# Patient Record
Sex: Male | Born: 1995
Health system: Southern US, Community
[De-identification: ages and names within clinical notes are randomized; demographics above are authoritative.]

## PROBLEM LIST (undated history)

## (undated) HISTORY — PX: TONSILLECTOMY: SUR1361

---
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
## 2017-01-18 ENCOUNTER — Emergency Department
Admission: EM | Admit: 2017-01-18 | Discharge: 2017-01-18 | Disposition: A | Discharge status: Home / Self Care | Payer: Medicaid Other | Attending: Emergency Medicine | Admitting: Emergency Medicine

## 2017-01-18 ENCOUNTER — Encounter: Payer: Self-pay | Admitting: Emergency Medicine

## 2017-01-18 ENCOUNTER — Other Ambulatory Visit: Payer: Self-pay

## 2017-01-18 DIAGNOSIS — Z79899 Other long term (current) drug therapy: Secondary | ICD-10-CM | POA: Insufficient documentation

## 2017-01-18 DIAGNOSIS — F17200 Nicotine dependence, unspecified, uncomplicated: Secondary | ICD-10-CM | POA: Insufficient documentation

## 2017-01-18 DIAGNOSIS — L509 Urticaria, unspecified: Secondary | ICD-10-CM | POA: Insufficient documentation

## 2017-01-18 MED ORDER — CETIRIZINE HCL 5 MG/5ML PO SOLN
5.0000 mg | Freq: Once | ORAL | Status: DC
  Filled 2017-01-18: qty 5

## 2017-01-18 MED ORDER — CETIRIZINE HCL 5 MG/5ML PO SOLN: 5.0000 mg | Freq: Every day | ORAL | 0 refills | Status: DC

## 2017-01-18 MED ORDER — CETIRIZINE HCL 10 MG PO TABS
5.0000 mg | ORAL_TABLET | Freq: Once | ORAL | Status: AC
  Administered 2017-01-18: 5 mg via ORAL
  Filled 2017-01-18: qty 1

## 2017-01-18 NOTE — ED Provider Notes (Signed)
[facility] Emergency Department Provider Note  ____________________________________________  Time seen: Approximately 6:05 AM  I have reviewed the triage vital signs and the nursing notes.   HISTORY  Chief Complaint Urticaria   HPI Edward Miller is a 21 y.o. male no significant past medical history who presents for evaluation of hives. Patient reports that he has had generalized hives since yesterday which is pruritic and intermittent. He has been taking Benadryl at home with some relief but they keep recurring. No angioedema, no difficulty swallowing or breathing, no vomiting or diarrhea. Patient denies any history of allergic reactions. No new soaps, detergent, skin products, no medications. Patient denies any recent illness.  History reviewed. No pertinent past medical history.  There are no active problems to display for this patient.   Past Surgical History:  Procedure Laterality Date  . TONSILLECTOMY      Prior to Admission medications   Medication Sig Start Date End Date Taking? Authorizing Provider  cetirizine HCl (ZYRTEC) 5 MG/5ML SOLN Take 5 mLs (5 mg total) by mouth daily. 01/18/17   Nita SickleVeronese, [place], MD  naproxen (NAPROSYN) 500 MG tablet Take 1 tablet (500 mg total) by mouth 2 (two) times daily with a meal. 04/20/15   Joni ReiningSmith, Ronald K, PA-C    Allergies Corn-containing products and Orange fruit [citrus]  No family history on file.  Social History Social History   Tobacco Use  . Smoking status: Current Every Day Smoker  . Smokeless tobacco: Never Used  Substance Use Topics  . Alcohol use: No  . Drug use: No    Review of Systems  Constitutional: Negative for fever. Eyes: Negative for visual changes. ENT: Negative for sore throat. Neck: No neck pain  Cardiovascular: Negative for chest pain. Respiratory: Negative for shortness of breath. Gastrointestinal: Negative for abdominal pain, vomiting or diarrhea. Genitourinary:  Negative for dysuria. Musculoskeletal: Negative for back pain. Skin: Negative for rash. + hives Neurological: Negative for headaches, weakness or numbness. Psych: No SI or HI  ____________________________________________   PHYSICAL EXAM:  VITAL SIGNS: ED Triage Vitals  Enc Vitals Group     BP 01/18/17 0551 132/81     Pulse Rate 01/18/17 0551 75     Resp 01/18/17 0551 18     Temp 01/18/17 0551 97.7 F (36.5 C)     Temp Source 01/18/17 0551 Oral     SpO2 01/18/17 0551 99 %     Weight 01/18/17 0552 180 lb (81.6 kg)     Height 01/18/17 0552 6\' 1"  (1.854 m)     Head Circumference --      Peak Flow --      Pain Score --      Pain Loc --      Pain Edu? --      Excl. in GC? --     Constitutional: Alert and oriented. Well appearing and in no apparent distress. HEENT:      Head: Normocephalic and atraumatic.         Eyes: Conjunctivae are normal. Sclera is non-icteric.       Mouth/Throat: Mucous membranes are moist. Oropharynx is clear, uvula is midline, no angioedema, no swelling, airways patent, no stridor      Neck: Supple with no signs of meningismus. Cardiovascular: Regular rate and rhythm. No murmurs, gallops, or rubs. 2+ symmetrical distal pulses are present in all extremities. No JVD. Respiratory: Normal respiratory effort. Lungs are clear to auscultation bilaterally. No wheezes, crackles, or rhonchi.  Gastrointestinal:  Soft, non tender, and non distended with positive bowel sounds. No rebound or guarding. Musculoskeletal: Nontender with normal range of motion in all extremities. No edema, cyanosis, or erythema of extremities. Neurologic: Normal speech and language. Face is symmetric. Moving all extremities. No gross focal neurologic deficits are appreciated. Skin: Skin is warm, dry and intact. Diffuse hives Psychiatric: Mood and affect are normal. Speech and behavior are normal.  ____________________________________________   LABS (all labs ordered are listed, but only  abnormal results are displayed)  Labs Reviewed - No data to display ____________________________________________  EKG  none  ____________________________________________  RADIOLOGY  none  ____________________________________________   PROCEDURES  Procedure(s) performed: None Procedures Critical Care performed:  None ____________________________________________   INITIAL IMPRESSION / ASSESSMENT AND PLAN / ED COURSE  21 y.o. male no significant past medical history who presents for evaluation of hives x 1 day of unknown trigger. No evidence of anaphylaxis. Will give cetirizine and dc home with prescription. I discussed signs and symptoms of anaphylaxis and recommended return to the emergency room. Otherwise follow up with PCP for allergist referral.      As part of my medical decision making, I reviewed the following data within the electronic MEDICAL RECORD NUMBER Nursing notes reviewed and incorporated, Notes from prior ED visits and [place] Controlled Substance Database    Pertinent labs & imaging results that were available during my care of the patient were reviewed by me and considered in my medical decision making (see chart for details).    ____________________________________________   FINAL CLINICAL IMPRESSION(S) / ED DIAGNOSES  Final diagnoses:  Hives      NEW MEDICATIONS STARTED DURING THIS VISIT:  ED Discharge Orders        Ordered    cetirizine HCl (ZYRTEC) 5 MG/5ML SOLN  Daily     01/18/17 0604       Note:  This document was prepared using Dragon voice recognition software and may include unintentional dictation errors.    Nita SickleVeronese, [place], MD 01/18/17 [phone]

## 2017-01-18 NOTE — ED Triage Notes (Addendum)
Patient to ER for c/o "hives" x2 days. Patient has taken Benadryl, but symptoms return. Patient denies any known new products, foods, etc. Patient alert and oriented with no acute distress.

## 2017-01-23 IMAGING — CR DG ANKLE COMPLETE 3+V*R*
3 series · 3 of 3 positions shown · non-contrast
Comparison: None.

CLINICAL DATA: Injury yesterday

EXAM:
RIGHT ANKLE - COMPLETE 3+ VIEW

[ankle ap]
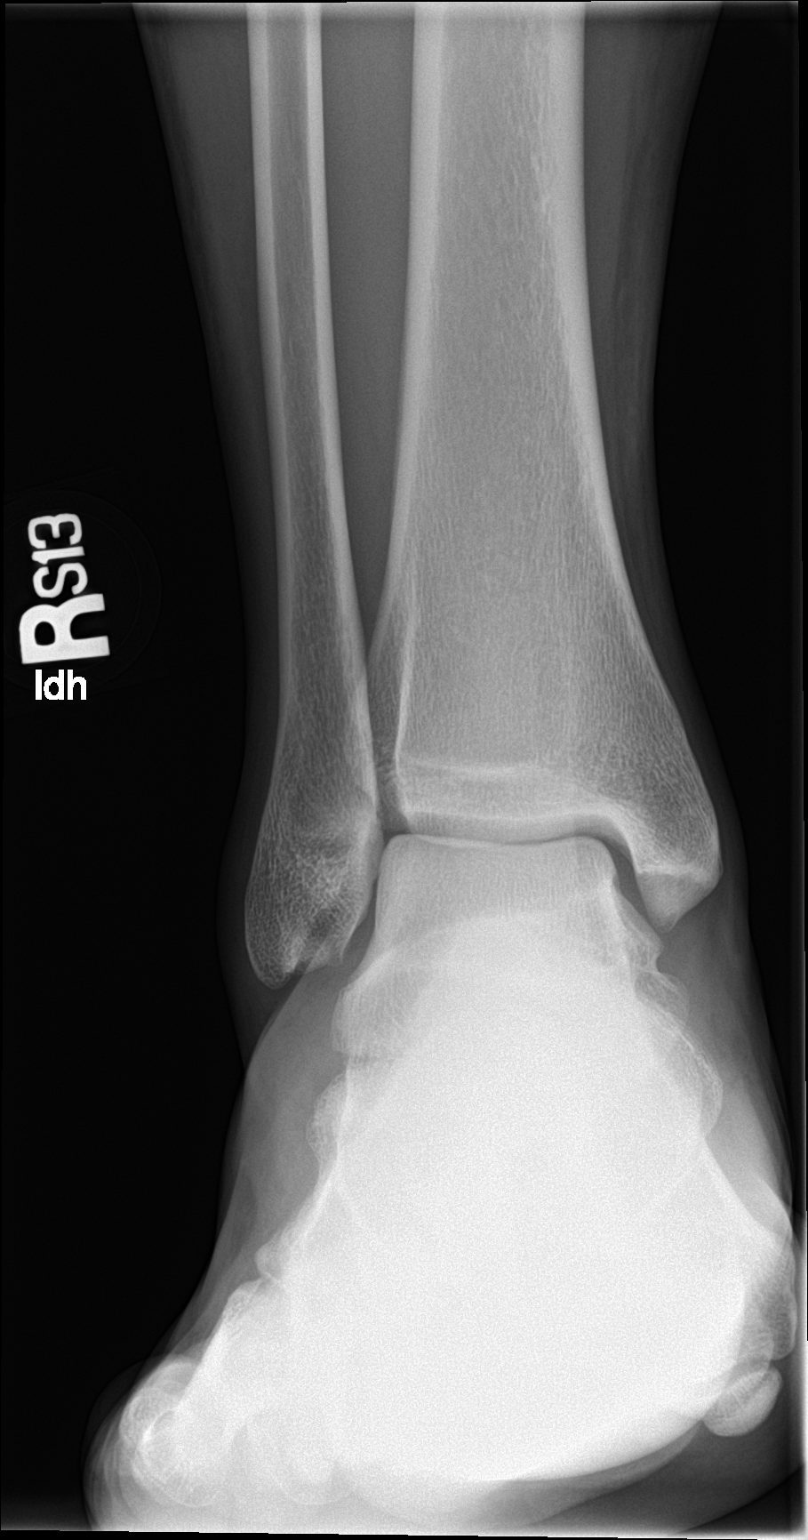

[ankle obl]
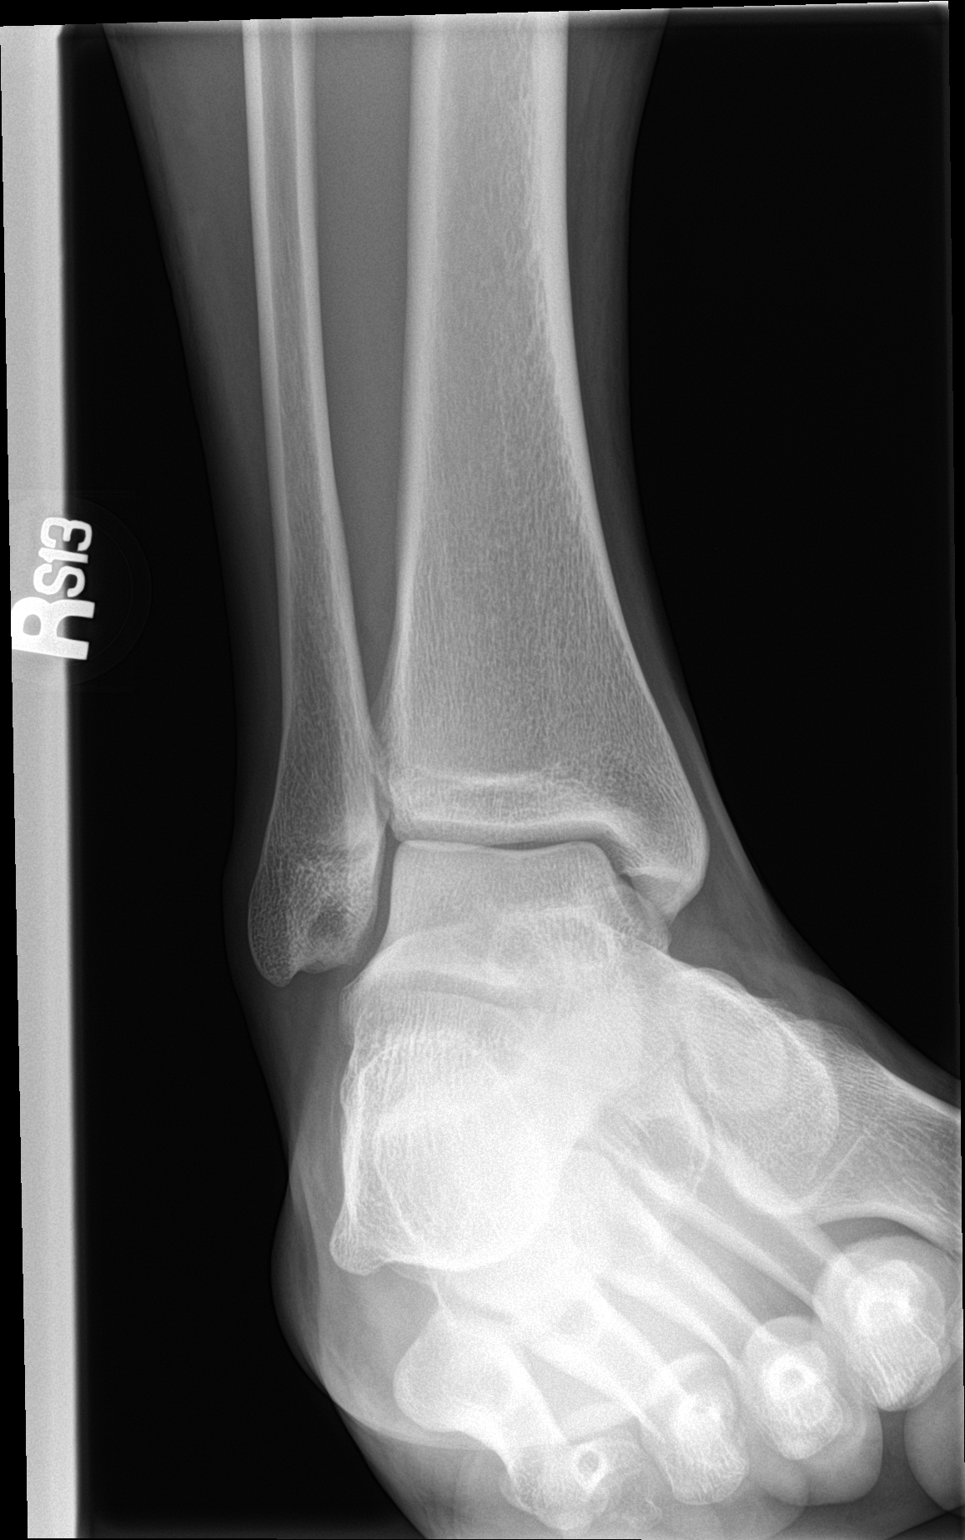

[ankle lat]
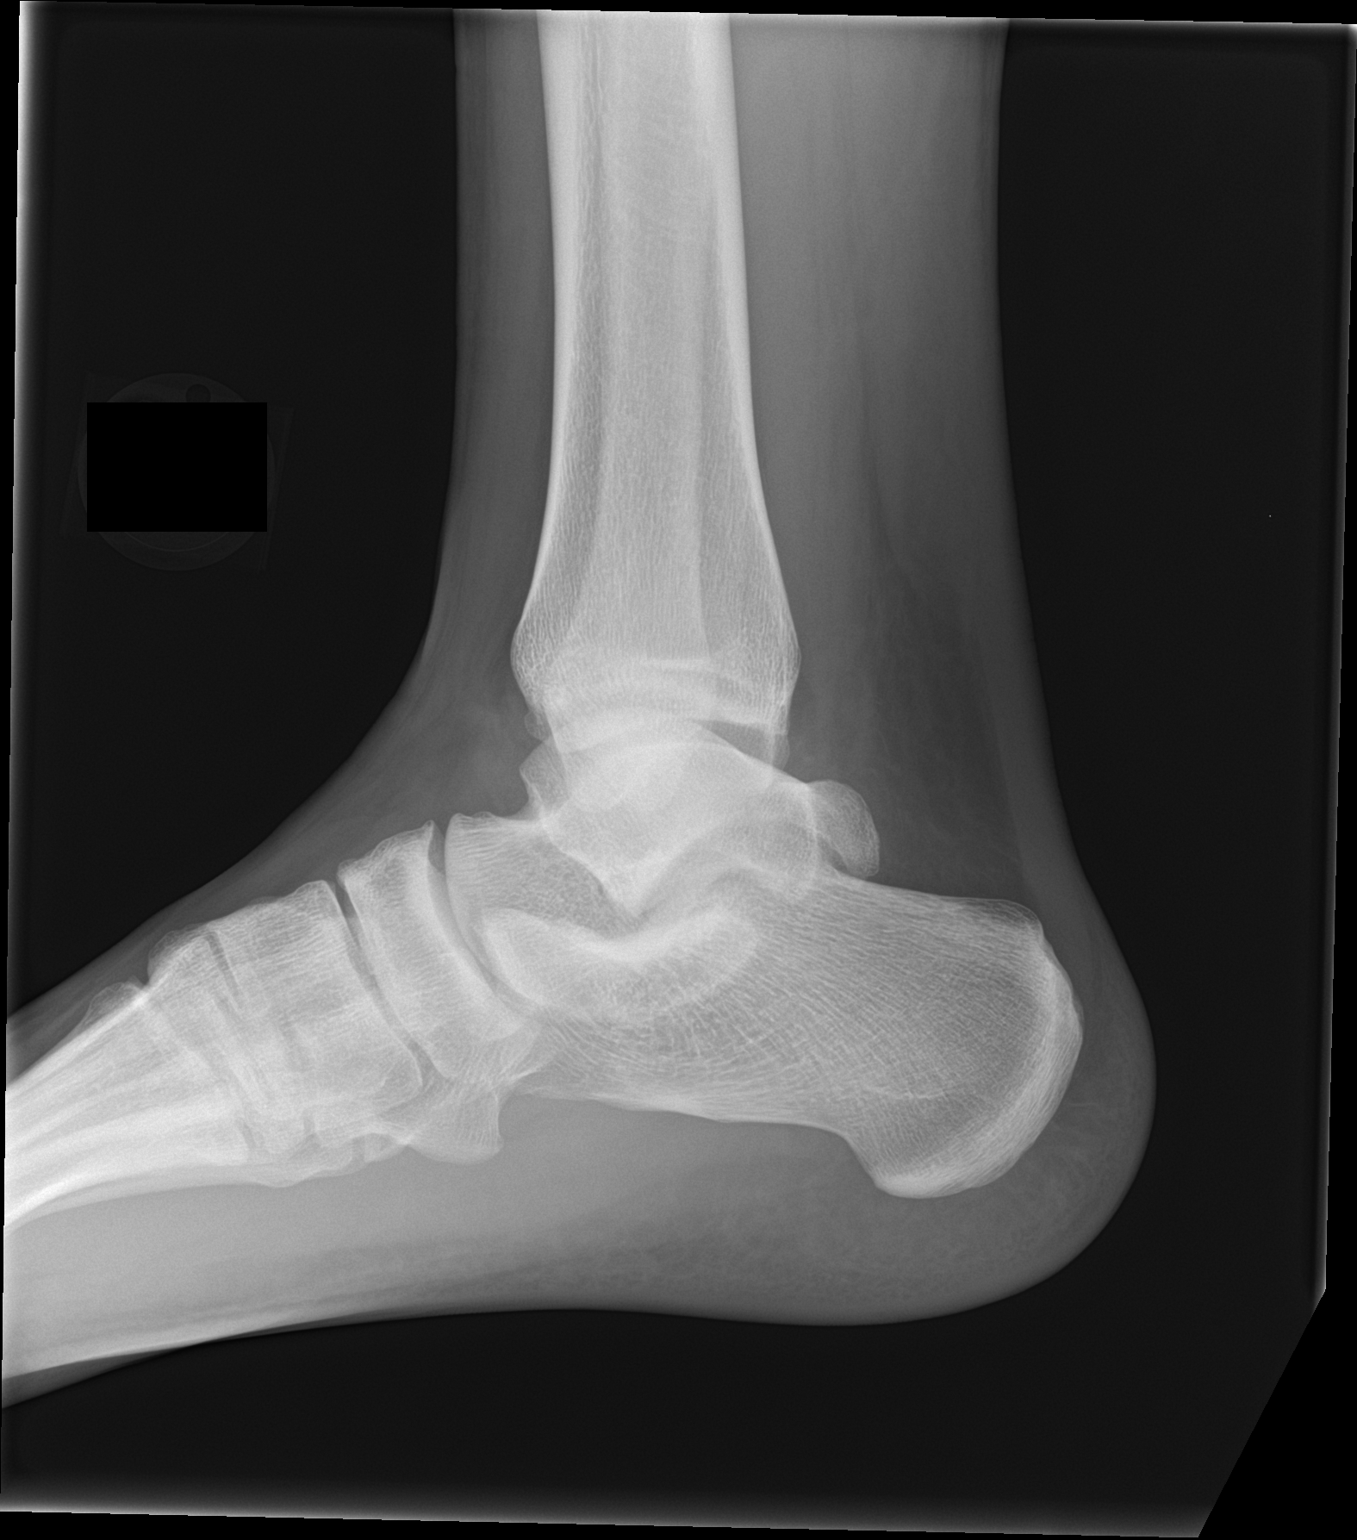

[3 of 3 positions shown; findings below may reference images not displayed]

FINDINGS: No acute fracture. No dislocation.  Unremarkable soft tissues.
IMPRESSION: No acute bony pathology.

## 2018-08-21 ENCOUNTER — Other Ambulatory Visit: Payer: Self-pay

## 2018-08-21 ENCOUNTER — Other Ambulatory Visit (HOSPITAL_COMMUNITY)
Admission: RE | Admit: 2018-08-21 | Discharge: 2018-08-21 | Disposition: A | Discharge status: Home / Self Care | Payer: No Typology Code available for payment source | Source: Ambulatory Visit | Attending: Family Medicine | Admitting: Family Medicine

## 2018-08-21 ENCOUNTER — Ambulatory Visit (INDEPENDENT_AMBULATORY_CARE_PROVIDER_SITE_OTHER): Payer: No Typology Code available for payment source | Admitting: Family Medicine

## 2018-08-21 ENCOUNTER — Encounter: Payer: Self-pay | Admitting: Family Medicine

## 2018-08-21 VITALS — BP 126/74 | HR 83 | Temp 97.9°F | Resp 16 | Ht 73.0 in | Wt 180.9 lb

## 2018-08-21 DIAGNOSIS — Z13220 Encounter for screening for lipoid disorders: Secondary | ICD-10-CM

## 2018-08-21 DIAGNOSIS — Z131 Encounter for screening for diabetes mellitus: Secondary | ICD-10-CM

## 2018-08-21 DIAGNOSIS — Z113 Encounter for screening for infections with a predominantly sexual mode of transmission: Secondary | ICD-10-CM | POA: Diagnosis present

## 2018-08-21 DIAGNOSIS — Z23 Encounter for immunization: Secondary | ICD-10-CM

## 2018-08-21 DIAGNOSIS — Z0000 Encounter for general adult medical examination without abnormal findings: Secondary | ICD-10-CM | POA: Diagnosis present

## 2018-08-21 DIAGNOSIS — Z1159 Encounter for screening for other viral diseases: Secondary | ICD-10-CM

## 2018-08-21 NOTE — Progress Notes (Signed)
Name: Edward Miller   MRN: [id number]    DOB: 12/27/1995   Date:08/21/2018       Progress Note  Subjective  Chief Complaint  Chief Complaint  Patient presents with  . Establish Care    HPI  Patient presents for annual CPE and to establish care.    Social: Living with his maternal aunt.  [place], not in contact with Dad.  No siblings. Not dating, ID's as heterosexual.  Not currently working due to COVID-19, he is not currently in school.  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   USPSTF grade A and B recommendations:  Diet: Eats 2 meals a day, eats out almost ever meal. Eats salads once a week, otherwise not much for veggies. Exercise: Does run and lift weights.   Depression: phq 9 is negative [facility] screen [facility] 08/21/2018  Decreased Interest 0  Down, Depressed, Hopeless 0  PHQ - 2 Score 0  Altered sleeping 0  Tired, decreased energy 0  Change in appetite 0  Feeling bad or failure about yourself  0  Trouble concentrating 0  Moving slowly or fidgety/restless 0  Suicidal thoughts 0  PHQ-9 Score 0  Difficult doing work/chores Not difficult at all    Hypertension:  BP Readings from Last 3 Encounters:  08/21/18 126/74  01/18/17 132/81  04/20/15 123/81 (61 %, Z = 0.28 /  85 %, Z = 1.03)*   *BP percentiles are based on the 2017 AAP Clinical Practice Guideline for boys   Obesity: Wt Readings from Last 3 Encounters:  08/21/18 180 lb 14.4 oz (82.1 kg)  01/18/17 180 lb (81.6 kg) (82 %, Z= 0.91)*  04/20/15 195 lb (88.5 kg) (94 %, Z= 1.52)*   * Growth percentiles are based on CDC (Boys, 2-20 Years) data.   BMI Readings from Last 3 Encounters:  08/21/18 23.87 kg/m  01/18/17 23.75 kg/m (63 %, Z= 0.33)*  04/20/15 25.73 kg/m (87 %, Z= 1.12)*   * Growth percentiles are based on CDC (Boys, 2-20 Years) data.    Lipids:  No results found for: CHOL No results found for: HDL No results found for: LDLCALC No results found for: TRIG No results  found for: CHOLHDL No results found for: LDLDIRECT Glucose:  No results found for: GLUCOSE, GLUCAP    [facility]  AUDIT-C Score  1     Varies in frequency, does binge drink when he drinks.  Using marijuana - using every other day right now.   Single STD testing and prevention (HIV/chl/gon/syphilis): We will check today Hep C: We will check today  Skin cancer: No concerning lesions Colorectal cancer: Denies family or personal history of colorectal cancer, no changes in BM's - no blood in stool, dark and tarry stool, mucus in stool, or constipation/diarrhea.  Prostate/Testicular cancer: No family history, asymptomatic  Lung cancer:  Never smoker. Low Dose CT Chest recommended if Age 65-80 years, 30 pack-year currently smoking OR have quit w/in 15years. Patient does not qualify.   AAA: N/A The USPSTF recommends one-time screening with ultrasonography in men ages 8665 to 3375 years who have ever smoked ECG:  Denies chest pain or shortness of breath.   Advanced Care Planning: A voluntary discussion about advance care planning including the explanation and discussion of advance directives.  Discussed health care proxy and Living will, and the patient was able to identify a health care proxy as Mom - Edward Miller.  Patient does not have a living will at present time. If patient does have living will, I have requested they bring this to the clinic to be scanned in to their chart.  There are no active problems to display for this patient.   Past Surgical History:  Procedure Laterality Date  . TONSILLECTOMY      Family History  Problem Relation Age of Onset  . Thyroid disease Mother     Social History   Socioeconomic History  . Marital status: Single    Spouse name: Not on file  . Number of children: 0  . Years of education: 32  . Highest education level: 12th grade  Occupational History  . Occupation: unemployed  Social Needs   . Financial resource strain: Not hard at all  . Food insecurity    Worry: Never true    Inability: Never true  . Transportation needs    Medical: No    Non-medical: No  Tobacco Use  . Smoking status: Current Some Day Smoker    Types: Cigarettes  . Smokeless tobacco: Never Used  Substance and Sexual Activity  . Alcohol use: Yes  . Drug use: Yes    Types: Marijuana  . Sexual activity: Yes    Partners: Female    Birth control/protection: Condom    Comment: not every time  Lifestyle  . Physical activity    Days per week: 3 days    Minutes per session: 60 min  . Stress: Not at all  Relationships  . Social connections    Talks on phone: More than three times a week    Gets together: More than three times a week    Attends religious service: 1 to 4 times per year    Active member of club or organization: No    Attends meetings of clubs or organizations: Never    Relationship status: Never married  . Intimate partner violence    Fear of current or ex partner: No    Emotionally abused: No    Physically abused: No    Forced sexual activity: No  Other Topics Concern  . Not on file  Social History Narrative  . Not on file     Current Outpatient Medications:  .  cetirizine HCl (ZYRTEC) 5 MG/5ML SOLN, Take 5 mLs (5 mg total) by mouth daily. (Patient not taking: Reported on 08/21/2018), Disp: 50 mL, Rfl: 0 .  naproxen (NAPROSYN) 500 MG tablet, Take 1 tablet (500 mg total) by mouth 2 (two) times daily with a meal. (Patient not taking: Reported on 08/21/2018), Disp: 20 tablet, Rfl: 0  Allergies  Allergen Reactions  . Corn-Containing Products Rash  . Orange Fruit [Citrus] Rash     ROS  Constitutional: Negative for fever or weight change.  Respiratory: Negative for cough and shortness of breath.   Cardiovascular: Negative for chest pain or palpitations.  Gastrointestinal: Negative for abdominal pain, no bowel changes.  Musculoskeletal: Negative for gait problem or joint  swelling.  Skin: Negative for rash.  Neurological: Negative for dizziness or headache.  No other specific complaints in a complete review of systems (except as listed in HPI above).  Objective  Vitals:   08/21/18 1343  BP: 126/74  Pulse: 83  Resp: 16  Temp: 97.9 F (36.6 C)  TempSrc: Oral  SpO2: 99%  Weight: 180 lb 14.4 oz (82.1 kg)  Height: 6\' 1"  (1.854 m)    Body mass index is 23.87 kg/m.  Physical Exam  Constitutional: Patient  appears well-developed and well-nourished. No distress.  HENT: Head: Normocephalic and atraumatic. Ears: B TMs ok, no erythema or effusion; Nose: Nose normal. Mouth/Throat: Oropharynx is clear and moist. No oropharyngeal exudate.  Eyes: Conjunctivae and EOM are normal. Pupils are equal, round, and reactive to light. No scleral icterus.  Neck: Normal range of motion. Neck supple. No JVD present. No thyromegaly present.  Cardiovascular: Normal rate, regular rhythm and normal heart sounds.  No murmur heard. No BLE edema. Pulmonary/Chest: Effort normal and breath sounds normal. No respiratory distress. Abdominal: Soft. Bowel sounds are normal, no distension. There is no tenderness. no masses MALE GENITALIA: Deferred Musculoskeletal: Normal range of motion, no joint effusions. No gross deformities Neurological: he is alert and oriented to person, place, and time. No cranial nerve deficit. Coordination, balance, strength, speech and gait are normal.  Skin: Skin is warm and dry. No rash noted. No erythema.  Psychiatric: Patient has a normal mood and affect. behavior is normal. Judgment and thought content normal.  No results found for this or any previous visit (from the past 2160 hour(s)).   PHQ2/9: Depression screen [facility] 2/[facility] 08/21/2018  Decreased Interest 0  Down, Depressed, Hopeless 0  PHQ - 2 Score 0  Altered sleeping 0  Tired, decreased energy 0  Change in appetite 0  Feeling bad or failure about yourself  0  Trouble concentrating 0  Moving  slowly or fidgety/restless 0  Suicidal thoughts 0  PHQ-9 Score 0  Difficult doing work/chores Not difficult at all    Fall Risk: Fall Risk  08/21/2018  Falls in the past year? 0  Number falls in past yr: 0  Injury with Fall? 0  Follow up Falls evaluation completed    Assessment & Plan  1. Annual physical exam -Prostate cancer screening and PSA options (with potential risks and benefits of testing vs not testing) were discussed along with recent recs/guidelines. -USPSTF grade A and B recommendations reviewed with patient; age-appropriate recommendations, preventive care, screening tests, etc discussed and encouraged; healthy living encouraged; see AVS for patient education given to patient -Discussed importance of 150 minutes of physical activity weekly, eat two servings of fish weekly, eat one serving of tree nuts ( cashews, pistachios, pecans, almonds.Marland Kitchen.) every other day, eat 6 servings of fruit/vegetables daily and drink plenty of water and avoid sweet beverages.  - Lipid panel - COMPLETE METABOLIC PANEL WITH GFR - HIV Antibody (routine testing w rflx) - RPR - Urine cytology ancillary only - Hepatitis C antibody  2. Lipid screening - Lipid panel  3. Diabetes mellitus screening - COMPLETE METABOLIC PANEL WITH GFR  4. Routine screening for STI (sexually transmitted infection) - HIV Antibody (routine testing w rflx) - RPR - Urine cytology ancillary only  5. Need for hepatitis C screening test - Hepatitis C antibody

## 2018-08-21 NOTE — Patient Instructions (Signed)
Preventive Care 19-21 Years Old, Male Preventive care refers to lifestyle choices and visits with your health care provider that can promote health and wellness. This includes:  A yearly physical exam. This is also called an annual well check.  Regular dental and eye exams.  Immunizations.  Screening for certain conditions.  Healthy lifestyle choices, such as eating a healthy diet, getting regular exercise, not using drugs or products that contain nicotine and tobacco, and limiting alcohol use. What can I expect for my preventive care visit? Physical exam Your health care provider will check:  Height and weight. These may be used to calculate body mass index (BMI), which is a measurement that tells if you are at a healthy weight.  Heart rate and blood pressure.  Your skin for abnormal spots. Counseling Your health care provider may ask you questions about:  Alcohol, tobacco, and drug use.  Emotional well-being.  Home and relationship well-being.  Sexual activity.  Eating habits.  Work and work Statistician. What immunizations do I need?  Influenza (flu) vaccine  This is recommended every year. Tetanus, diphtheria, and pertussis (Tdap) vaccine  You may need a Td booster every 10 years. Varicella (chickenpox) vaccine  You may need this vaccine if you have not already been vaccinated. Human papillomavirus (HPV) vaccine  If recommended by your health care provider, you may need three doses over 6 months. Measles, mumps, and rubella (MMR) vaccine  You may need at least one dose of MMR. You may also need a second dose. Meningococcal conjugate (MenACWY) vaccine  One dose is recommended if you are 45-76 years old and a Market researcher living in a residence hall, or if you have one of several medical conditions. You may also need additional booster doses. Pneumococcal conjugate (PCV13) vaccine  You may need this if you have certain conditions and were not  previously vaccinated. Pneumococcal polysaccharide (PPSV23) vaccine  You may need one or two doses if you smoke cigarettes or if you have certain conditions. Hepatitis A vaccine  You may need this if you have certain conditions or if you travel or work in places where you may be exposed to hepatitis A. Hepatitis B vaccine  You may need this if you have certain conditions or if you travel or work in places where you may be exposed to hepatitis B. Haemophilus influenzae type b (Hib) vaccine  You may need this if you have certain risk factors. You may receive vaccines as individual doses or as more than one vaccine together in one shot (combination vaccines). Talk with your health care provider about the risks and benefits of combination vaccines. What tests do I need? Blood tests  Lipid and cholesterol levels. These may be checked every 5 years starting at age 17.  Hepatitis C test.  Hepatitis B test. Screening   Diabetes screening. This is done by checking your blood sugar (glucose) after you have not eaten for a while (fasting).  Sexually transmitted disease (STD) testing. Talk with your health care provider about your test results, treatment options, and if necessary, the need for more tests. Follow these instructions at home: Eating and drinking   Eat a diet that includes fresh fruits and vegetables, whole grains, lean protein, and low-fat dairy products.  Take vitamin and mineral supplements as recommended by your health care provider.  Do not drink alcohol if your health care provider tells you not to drink.  If you drink alcohol: ? Limit how much you have to 0-2  drinks a day. ? Be aware of how much alcohol is in your drink. In the [place] one drink equals one 12 oz bottle of beer (355 mL), one 5 oz glass of wine (148 mL), or one 1 oz glass of hard liquor (44 mL). Lifestyle  Take daily care of your teeth and gums.  Stay active. Exercise for at least 30 minutes on 5 or  more days each week.  Do not use any products that contain nicotine or tobacco, such as cigarettes, e-cigarettes, and chewing tobacco. If you need help quitting, ask your health care provider.  If you are sexually active, practice safe sex. Use a condom or other form of protection to prevent STIs (sexually transmitted infections). What's next?  Go to your health care provider once a year for a well check visit.  Ask your health care provider how often you should have your eyes and teeth checked.  Stay up to date on all vaccines. This information is not intended to replace advice given to you by your health care provider. Make sure you discuss any questions you have with your health care provider. Document Released: 03/12/2001 Document Revised: 01/08/2018 Document Reviewed: 01/08/2018 Elsevier Patient Education  2020 Elsevier Inc.   

## 2018-08-22 LAB — URINE CYTOLOGY ANCILLARY ONLY
Chlamydia: POSITIVE — AB
Neisseria Gonorrhea: NEGATIVE

## 2018-08-24 ENCOUNTER — Encounter: Payer: Self-pay | Admitting: Family Medicine

## 2018-08-24 ENCOUNTER — Other Ambulatory Visit: Payer: Self-pay

## 2018-08-24 ENCOUNTER — Ambulatory Visit (INDEPENDENT_AMBULATORY_CARE_PROVIDER_SITE_OTHER): Payer: No Typology Code available for payment source | Admitting: Family Medicine

## 2018-08-24 VITALS — BP 120/84 | HR 77 | Temp 97.8°F | Resp 18 | Ht 73.0 in | Wt 179.0 lb

## 2018-08-24 DIAGNOSIS — A749 Chlamydial infection, unspecified: Secondary | ICD-10-CM

## 2018-08-24 LAB — COMPLETE METABOLIC PANEL WITH GFR
AG Ratio: 2.2 (calc) (ref 1.0–2.5)
ALT: 13 U/L (ref 9–46)
AST: 15 U/L (ref 10–40)
Albumin: 4.7 g/dL (ref 3.6–5.1)
Alkaline phosphatase (APISO): 52 U/L (ref 36–130)
BUN: 15 mg/dL (ref 7–25)
CO2: 28 mmol/L (ref 20–32)
Calcium: 9.5 mg/dL (ref 8.6–10.3)
Chloride: 104 mmol/L (ref 98–110)
Creat: 1.11 mg/dL (ref 0.60–1.35)
GFR, Est African American: 110 mL/min/{1.73_m2} (ref >=60)
GFR, Est Non African American: 95 mL/min/{1.73_m2} (ref >=60)
Globulin: 2.1 g/dL (calc) (ref 1.9–3.7)
Glucose, Bld: 92 mg/dL (ref 65–99)
Potassium: 4.3 mmol/L (ref 3.5–5.3)
Sodium: 140 mmol/L (ref 135–146)
Total Bilirubin: 0.4 mg/dL (ref 0.2–1.2)
Total Protein: 6.8 g/dL (ref 6.1–8.1)

## 2018-08-24 LAB — LIPID PANEL
Cholesterol: 178 mg/dL (ref <200)
HDL: 52 mg/dL (ref >=40)
LDL Cholesterol (Calc): 112 mg/dL (calc) — ABNORMAL HIGH
Non-HDL Cholesterol (Calc): 126 mg/dL (calc) (ref <130)
Total CHOL/HDL Ratio: 3.4 (calc) (ref <5.0)
Triglycerides: 57 mg/dL (ref <150)

## 2018-08-24 LAB — RPR: RPR Ser Ql: NONREACTIVE

## 2018-08-24 LAB — HEPATITIS C ANTIBODY
Hepatitis C Ab: NONREACTIVE
SIGNAL TO CUT-OFF: 0.01 (ref <1.00)

## 2018-08-24 LAB — HIV ANTIBODY (ROUTINE TESTING W REFLEX): HIV 1&2 Ab, 4th Generation: NONREACTIVE

## 2018-08-24 MED ORDER — AZITHROMYCIN 250 MG PO TABS
1000.0000 mg | ORAL_TABLET | Freq: Once | ORAL | Status: DC
Start: 2018-08-24 — End: 2019-03-04

## 2018-08-24 NOTE — Progress Notes (Signed)
Name: Edward Miller   MRN: [id number]    DOB: 1995/04/16   Date:08/24/2018       Progress Note  Subjective  Chief Complaint  Chief Complaint  Patient presents with  . Follow-up    positive STD check    HPI  PT presents for chlamydia treatment - tested positive on routine testing.  His 1 male partner did tell him today that she tested positive and had treatmment 1 week ago, they have had no intercourse since then.  Azithromycin as witnessed therapy to be adminsitered today.  No abdominal pain, fevers/chills, or penile discharge.  All other STI's are negative.  There are no active problems to display for this patient.   Social History   Tobacco Use  . Smoking status: Never Smoker  . Smokeless tobacco: Never Used  Substance Use Topics  . Alcohol use: Yes     Current Outpatient Medications:  .  cetirizine HCl (ZYRTEC) 5 MG/5ML SOLN, Take 5 mLs (5 mg total) by mouth daily. (Patient not taking: Reported on 08/21/2018), Disp: 50 mL, Rfl: 0 .  naproxen (NAPROSYN) 500 MG tablet, Take 1 tablet (500 mg total) by mouth 2 (two) times daily with a meal. (Patient not taking: Reported on 08/21/2018), Disp: 20 tablet, Rfl: 0  Allergies  Allergen Reactions  . Corn-Containing Products Rash  . Orange Fruit [Citrus] Rash    I personally reviewed active problem list, medication list, allergies, notes from last encounter, lab results with the patient/caregiver today.  ROS  Ten systems reviewed and is negative except as mentioned in HPI  Objective  Vitals:   08/24/18 1335  BP: 120/84  Pulse: 77  Resp: 18  Temp: 97.8 F (36.6 C)  TempSrc: Oral  SpO2: 99%  Weight: 179 lb (81.2 kg)  Height: 6\' 1"  (1.854 m)    Body mass index is 23.62 kg/m.  Nursing Note and Vital Signs reviewed.  Physical Exam  Constitutional: Patient appears well-developed and well-nourished. No distress.  HENT: Head: Normocephalic and atraumatic.  Neck: Normal range of motion. Pulmonary/Chest: Effort  normal. No respiratory distress. Speaking in complete sentences Neurological: Pt is alert and oriented to person, place, and time. Coordination, speech and gait are normal.  Psychiatric: Patient has a normal mood and affect. behavior is normal. Judgment and thought content normal.  Results for orders placed or performed in visit on 08/21/18 (from the past 72 hour(s))  Lipid panel     Status: Abnormal   Collection Time: 08/21/18  2:29 PM  Result Value Ref Range   Cholesterol 178 <200 mg/dL   HDL 52 > OR = 40 mg/dL   Triglycerides 57 <150 mg/dL   LDL Cholesterol (Calc) 112 (H) mg/dL (calc)    Comment: Reference range: <100 . Desirable range <100 mg/dL for primary prevention;   <70 mg/dL for patients with CHD or diabetic patients  with > or = 2 CHD risk factors. Marland Kitchen LDL-C is now calculated using the Martin-Hopkins  calculation, which is a validated novel method providing  better accuracy than the Friedewald equation in the  estimation of LDL-C.  Cresenciano Genre et al. Annamaria Helling. 6834;196(22): 2061-2068  (http://education.[facility].com/faq/FAQ164)    Total CHOL/HDL Ratio 3.4 <5.0 (calc)   Non-HDL Cholesterol (Calc) 126 <130 mg/dL (calc)    Comment: For patients with diabetes plus 1 major ASCVD risk  factor, treating to a non-HDL-C goal of <100 mg/dL  (LDL-C of <70 mg/dL) is considered a therapeutic  option.   COMPLETE METABOLIC PANEL WITH GFR  Status: None   Collection Time: 08/21/18  2:29 PM  Result Value Ref Range   Glucose, Bld 92 65 - 99 mg/dL    Comment: .            Fasting reference interval .    BUN 15 7 - 25 mg/dL   Creat 1.611.11 0.960.60 - 0.451.35 mg/dL   GFR, Est Non African American 95 > OR = 60 mL/min/1.2273m2   GFR, Est African American 110 > OR = 60 mL/min/1.4673m2   BUN/Creatinine Ratio NOT APPLICABLE 6 - 22 (calc)   Sodium 140 135 - 146 mmol/L   Potassium 4.3 3.5 - 5.3 mmol/L   Chloride 104 98 - 110 mmol/L   CO2 28 20 - 32 mmol/L   Calcium 9.5 8.6 - 10.3 mg/dL   Total  Protein 6.8 6.1 - 8.1 g/dL   Albumin 4.7 3.6 - 5.1 g/dL   Globulin 2.1 1.9 - 3.7 g/dL (calc)   AG Ratio 2.2 1.0 - 2.5 (calc)   Total Bilirubin 0.4 0.2 - 1.2 mg/dL   Alkaline phosphatase (APISO) 52 36 - 130 U/L   AST 15 10 - 40 U/L   ALT 13 9 - 46 U/L  HIV Antibody (routine testing w rflx)     Status: None   Collection Time: 08/21/18  2:29 PM  Result Value Ref Range   HIV 1&2 Ab, 4th Generation NON-REACTIVE NON-REACTI    Comment: HIV-1 antigen and HIV-1/HIV-2 antibodies were not detected. There is no laboratory evidence of HIV infection. Marland Kitchen. PLEASE NOTE: This information has been disclosed to you from records whose confidentiality may be protected by state law.  If your state requires such protection, then the state law prohibits you from making any further disclosure of the information without the specific written consent of the person to whom it pertains, or as otherwise permitted by law. A general authorization for the release of medical or other information is NOT sufficient for this purpose. . For additional information please refer to http://education.[facility].com/faq/FAQ106 (This link is being provided for informational/ educational purposes only.) . Marland Kitchen. The performance of this assay has not been clinically validated in patients less than 21 years old. .   RPR     Status: None   Collection Time: 08/21/18  2:29 PM  Result Value Ref Range   RPR Ser Ql NON-REACTIVE NON-REACTI  Hepatitis C antibody     Status: None   Collection Time: 08/21/18  2:29 PM  Result Value Ref Range   Hepatitis C Ab NON-REACTIVE NON-REACTI   SIGNAL TO CUT-OFF 0.01 <1.00    Comment: . HCV antibody was non-reactive. There is no laboratory  evidence of HCV infection. . In most cases, no further action is required. However, if recent HCV exposure is suspected, a test for HCV RNA (test code [id number]) is suggested. . For additional information please refer to  http://education.[facility].com/faq/FAQ22v1 (This link is being provided for informational/ educational purposes only.) .     Assessment & Plan  1. Chlamydia infection - azithromycin (ZITHROMAX) tablet 1,000 mg - Witnessed therapy; wait 7 days prior to intercourse; recommend consistent condom use. -Red flags and when to present for emergency care or RTC including fever >101.24F, chest pain, shortness of breath, new/worsening/un-resolving symptoms, reviewed with patient at time of visit. Follow up and care instructions discussed and provided in AVS.

## 2018-08-24 NOTE — Patient Instructions (Signed)
Chlamydia, Male  Chlamydia is an STD (sexually transmitted disease). It is a bacterial infection that spreads through sexual contact (is contagious). Chlamydia can occur in different areas of the body, including the tube that moves urine from the bladder out of the body (urethra), the throat, or the rectum. This condition is not difficult to treat. However, if left untreated, chlamydia can lead to more serious health problems. What are the causes? Chlamydia is caused by the bacteria Chlamydia trachomatis. It is passed from an infected partner during sexual activity. Chlamydia can spread through contact with the genitals, mouth, or rectum. What are the signs or symptoms? In some cases, there may not be any symptoms for this condition (asymptomatic), especially early in the infection. If symptoms develop, they may include:  Burning when urinating.  Urinating frequently.  Pain or swelling in the testicles.  Watery, mucus-like discharge from the penis.  Redness, soreness, and swelling (inflammation) of the rectum.  Bleeding or discharge from the rectum.  Abdominal pain.  Itching, burning, or redness in the eyes, or discharge from the eyes. How is this diagnosed? This condition may be diagnosed based on:  Urine tests.  Swab tests. Depending on your symptoms, your health care provider may use a cotton swab to collect discharge from your urethra or rectum to test for the bacteria. How is this treated? This condition is treated with antibiotic medicines. Follow these instructions at home: Medicines  Take over-the-counter and prescription medicines only as told by your health care provider.  Take your antibiotic medicine as told by your health care provider. Do not stop taking the antibiotic even if you start to feel better. Sexual activity  Tell sexual partners about your infection. This includes any oral, anal, or vaginal sex partners you have had within 60 days of when your symptoms  started. Sexual partners should also be treated, even if they have no signs of the disease.  Do not have sex until you and your sexual partners have completed treatment and your health care provider says it is okay. If your health care provider prescribed you a single dose treatment, wait 7 days after taking the treatment before having sex. General instructions  It is your responsibility to get your test results. Ask your health care provider, or the department performing the test, when your results will be ready.  Get plenty of rest.  Eat a healthy, well-balanced diet.  Drink enough fluids to keep your urine clear or pale yellow.  Keep all follow-up visits as told by your health care provider. This is important. You may need to be tested for infection again 3 months after treatment. How is this prevented? The only sure way to prevent chlamydia is to avoid sexual intercourse. However, you can lower your risk by:  Using latex condoms correctly every time you have sexual intercourse.  Not having multiple sexual partners.  Asking if your sexual partner has been tested for STIs and had negative results. Contact a health care provider if:  You develop new symptoms or your symptoms do not get better after completing treatment.  You have a fever or chills.  You have pain during sexual intercourse.  You develop new joint pain or swelling near your joints.  You have pain or soreness in your testicles. Get help right away if:  Your pain gets worse and does not get better with medicine.  You have abnormal discharge.  You develop flu-like symptoms, such as night sweats, sore throat, or muscle aches. Summary    Chlamydia is an STD (sexually transmitted disease). It is a bacterial infection that spreads (is contagious) through sexual contact.  This condition is not difficult to treat, however, if left untreated, it can lead to more serious health problems.  In some cases, there may not  be any symptoms for this condition (asymptomatic).  This condition is treated with antibiotic medicines.  Using latex condoms correctly every time you have sexual intercourse can help prevent chlamydia. This information is not intended to replace advice given to you by your health care provider. Make sure you discuss any questions you have with your health care provider. Document Released: 01/14/2005 Document Revised: 01/29/2017 Document Reviewed: 01/01/2016 Elsevier Patient Education  2020 Elsevier Inc.  

## 2019-03-04 ENCOUNTER — Telehealth: Payer: Self-pay | Admitting: Family Medicine

## 2019-03-04 ENCOUNTER — Other Ambulatory Visit: Payer: Self-pay

## 2019-03-04 ENCOUNTER — Ambulatory Visit (INDEPENDENT_AMBULATORY_CARE_PROVIDER_SITE_OTHER): Payer: No Typology Code available for payment source | Admitting: Family Medicine

## 2019-03-04 ENCOUNTER — Encounter: Payer: Self-pay | Admitting: Family Medicine

## 2019-03-04 VITALS — BP 122/68 | HR 92 | Temp 97.7°F | Resp 14 | Ht 73.0 in | Wt 184.2 lb

## 2019-03-04 DIAGNOSIS — B351 Tinea unguium: Secondary | ICD-10-CM

## 2019-03-04 DIAGNOSIS — M722 Plantar fascial fibromatosis: Secondary | ICD-10-CM

## 2019-03-04 DIAGNOSIS — B353 Tinea pedis: Secondary | ICD-10-CM

## 2019-03-04 MED ORDER — MELOXICAM 15 MG PO TABS: 15.0000 mg | ORAL_TABLET | Freq: Every day | ORAL | 0 refills | Status: DC

## 2019-03-04 MED ORDER — TERBINAFINE HCL 1 % EX CREA: 1.0000 "application " | TOPICAL_CREAM | Freq: Two times a day (BID) | CUTANEOUS | 0 refills | Status: DC

## 2019-03-04 MED ORDER — TERBINAFINE HCL 1 % EX CREA
1.0000 | TOPICAL_CREAM | Freq: Two times a day (BID) | CUTANEOUS | 0 refills | Status: DC
Start: 2019-03-04 — End: 2019-03-04

## 2019-03-04 NOTE — Telephone Encounter (Signed)
Pharmacy switched and meds resent.

## 2019-03-04 NOTE — Addendum Note (Signed)
Addended by: Doren Custard on: 03/04/2019 01:01 PM   Modules accepted: Orders

## 2019-03-04 NOTE — Progress Notes (Addendum)
Name: QUANTAVIOUS EGGERT   MRN: [id number]    DOB: 10/21/1995   Date:03/04/2019       Progress Note  Subjective  Chief Complaint  Chief Complaint  Patient presents with  . Foot Pain    onset 3 days.  Pt states feels sharp shooting pain to heel that comes and goes    HPI  Pt presents for foot pain concerns:  LEFT heel - sharp pain for a few days.  States not worse/better with standing/stretching/resting - pain just occurs randomly.  No overuse, no injury, no swelling/redness, calf pain, weakness/numbness/tingling.   Bilateral great toe onychomycosis: Notes thickened, hyperpigmented great toenails for about two years - says started after he accidentally dropped a weight on them at that time.   There are no problems to display for this patient.   Past Surgical History:  Procedure Laterality Date  . TONSILLECTOMY      Family History  Problem Relation Age of Onset  . Thyroid disease Mother     Social History   Socioeconomic History  . Marital status: Single    Spouse name: Not on file  . Number of children: 0  . Years of education: 58  . Highest education level: 12th grade  Occupational History  . Occupation: unemployed  Tobacco Use  . Smoking status: Never Smoker  . Smokeless tobacco: Never Used  Substance and Sexual Activity  . Alcohol use: Yes  . Drug use: Yes    Types: Marijuana  . Sexual activity: Yes    Partners: Female    Birth control/protection: Condom    Comment: not every time  Other Topics Concern  . Not on file  Social History Narrative  . Not on file   Social Determinants of Health   Financial Resource Strain: Low Risk   . Difficulty of Paying Living Expenses: Not hard at all  Food Insecurity: No Food Insecurity  . Worried About Charity fundraiser in the Last Year: Never true  . Ran Out of Food in the Last Year: Never true  Transportation Needs: No Transportation Needs  . Lack of Transportation (Medical): No  . Lack of Transportation  (Non-Medical): No  Physical Activity: Sufficiently Active  . Days of Exercise per Week: 3 days  . Minutes of Exercise per Session: 60 min  Stress: No Stress Concern Present  . Feeling of Stress : Not at all  Social Connections: Somewhat Isolated  . Frequency of Communication with Friends and Family: More than three times a week  . Frequency of Social Gatherings with Friends and Family: More than three times a week  . Attends Religious Services: 1 to 4 times per year  . Active Member of Clubs or Organizations: No  . Attends Archivist Meetings: Never  . Marital Status: Never married  Intimate Partner Violence: Not At Risk  . Fear of Current or Ex-Partner: No  . Emotionally Abused: No  . Physically Abused: No  . Sexually Abused: No    No current outpatient medications on file.  Allergies  Allergen Reactions  . Corn-Containing Products Rash  . Orange Fruit [Citrus] Rash    I personally reviewed active problem list, medication list, allergies, notes from last encounter with the patient/caregiver today.   ROS Ten systems reviewed and is negative except as mentioned in HPI  Objective  Vitals:   03/04/19 1226  BP: 122/68  Pulse: 92  Resp: 14  Temp: 97.7 F (36.5 C)  SpO2: 97%  Weight:  184 lb 3.2 oz (83.6 kg)  Height: 6\' 1"  (1.854 m)    Body mass index is 24.3 kg/m.  Physical Exam  Constitutional: Patient appears well-developed and well-nourished. No distress.  HENT: Head: Normocephalic and atraumatic.  Eyes: Conjunctivae and EOM are normal. No scleral icterus.   Neck: Normal range of motion. Neck supple. No JVD present.  Cardiovascular: Normal rate, regular rhythm and normal heart sounds.  No murmur heard. No BLE edema. Pulmonary/Chest: Effort normal and breath sounds normal. No respiratory distress. Musculoskeletal: Normal range of motion, no joint effusions. No gross deformities. There is tenderness along the arch and heel of the LEFT foot, no  pain/swelling/erythema in calves Neurological: Pt is alert and oriented to person, place, and time. No cranial nerve deficit. Coordination, balance, strength, speech and gait are normal.  Skin:There is maceration between the LEFT 4th and 5th toes, as well as the right 1st and 2nd and 4th and 5th toes.  Bilateral onychomycosis present to the bilateral great toes.  Psychiatric: Patient has a normal mood and affect. behavior is normal. Judgment and thought content normal.  No results found for this or any previous visit (from the past 72 hour(s)).  PHQ2/9: Depression screen [facility] 2/9 03/04/2019 08/24/2018 08/21/2018  Decreased Interest 0 0 0  Down, Depressed, Hopeless 0 0 0  PHQ - 2 Score 0 0 0  Altered sleeping 0 0 0  Tired, decreased energy 0 0 0  Change in appetite 0 0 0  Feeling bad or failure about yourself  0 0 0  Trouble concentrating 0 0 0  Moving slowly or fidgety/restless 0 0 0  Suicidal thoughts 0 0 0  PHQ-9 Score 0 0 0  Difficult doing work/chores Not difficult at all Not difficult at all Not difficult at all   PHQ-2/9 Result is negative.    Fall Risk: Fall Risk  03/04/2019 08/24/2018 08/21/2018  Falls in the past year? 0 0 0  Number falls in past yr: 0 0 0  Injury with Fall? 0 0 0  Follow up - - Falls evaluation completed   Functional Status Survey: Is the patient deaf or have difficulty hearing?: No Does the patient have difficulty seeing, even when wearing glasses/contacts?: No Does the patient have difficulty concentrating, remembering, or making decisions?: No Does the patient have difficulty walking or climbing stairs?: No Does the patient have difficulty dressing or bathing?: No Does the patient have difficulty doing errands alone such as visiting a doctor's office or shopping?: No  Assessment & Plan  1. Onychomycosis of great toe - Ambulatory referral to Podiatry  2. Plantar fasciitis, left - Ambulatory referral to Podiatry - meloxicam (MOBIC) 15 MG tablet; Take 1  tablet (15 mg total) by mouth daily. Take with food.  Dispense: 30 tablet; Refill: 0 - Discussed rolling frozen water bottle three times a day minimum to help with pain and inflammation, gentle stretching.  3. Tinea pedis of both feet - Ambulatory referral to Podiatry - terbinafine (LAMISIL AT) 1 % cream; Apply 1 application topically 2 (two) times daily.  Dispense: 30 g; Refill: 0

## 2019-03-04 NOTE — Telephone Encounter (Signed)
Copied from CRM [id number]. Topic: General - Other >> Mar 04, 2019  1:15 PM Marylen Ponto wrote: Reason for CRM: Pt stated he needs his Rx to be sent to [facility] - [place], [place] - [address]   Phone: [phone] Fax: [phone]

## 2019-03-09 ENCOUNTER — Ambulatory Visit (INDEPENDENT_AMBULATORY_CARE_PROVIDER_SITE_OTHER): Payer: No Typology Code available for payment source | Admitting: Podiatry

## 2019-03-09 ENCOUNTER — Other Ambulatory Visit: Payer: Self-pay

## 2019-03-09 ENCOUNTER — Encounter: Payer: Self-pay | Admitting: Podiatry

## 2019-03-09 ENCOUNTER — Ambulatory Visit (INDEPENDENT_AMBULATORY_CARE_PROVIDER_SITE_OTHER): Payer: No Typology Code available for payment source

## 2019-03-09 DIAGNOSIS — B351 Tinea unguium: Secondary | ICD-10-CM | POA: Diagnosis not present

## 2019-03-09 DIAGNOSIS — M722 Plantar fascial fibromatosis: Secondary | ICD-10-CM

## 2019-03-09 MED ORDER — DICLOFENAC SODIUM 75 MG PO TBEC: 75.0000 mg | DELAYED_RELEASE_TABLET | Freq: Two times a day (BID) | ORAL | 1 refills | Status: DC

## 2019-03-10 NOTE — Progress Notes (Signed)
   Subjective: 24 y.o. male presenting today as a new patient with a chief complaint of intermittent sharp pain that began about one week ago. He reports associated throbbing pain of the heel. He began taking Mobic yesterday prescribed by his PCP. Being on the foot increases the pain.  He also reports discoloration and thickening of the toenails bilaterally that have been present for the past several months. He was prescribed Lamisil by his PCP which he started taking yesterday. There are no modifying factors noted. Patient is here for further evaluation and treatment.  History reviewed. No pertinent past medical history.   Objective: Physical Exam General: The patient is alert and oriented x3 in no acute distress.  Dermatology: Hyperkeratotic, discolored, thickened, onychodystrophy noted to nails 1-5 bilaterally. Skin is warm, dry and supple bilateral lower extremities. Negative for open lesions or macerations bilateral.   Vascular: Dorsalis Pedis and Posterior Tibial pulses palpable bilateral.  Capillary fill time is immediate to all digits.  Neurological: Epicritic and protective threshold intact bilateral.   Musculoskeletal: Tenderness to palpation to the plantar aspect of the left heel along the plantar fascia. All other joints range of motion within normal limits bilateral. Strength 5/5 in all groups bilateral.   Radiographic exam: Normal osseous mineralization. Joint spaces preserved. No fracture/dislocation/boney destruction. No other soft tissue abnormalities or radiopaque foreign bodies.   Assessment: 1. Plantar fasciitis left foot 2. Onychomycosis nails 1-5 bilaterally   Plan of Care:  1. Patient evaluated. Xrays reviewed.   2. Continue taking oral Lamisil 250 mg as directed by PCP.  3. Prescription for Diclofenac provided to patient. 4. Plantar fascial brace dispensed.  5. Return to clinic as needed.    Felecia Shelling, DPM [facility]  Dr. Felecia Shelling, DPM    [address] [place], [place] [address]                Office [phone]  Fax [phone]

## 2019-07-01 ENCOUNTER — Ambulatory Visit: Payer: No Typology Code available for payment source | Admitting: Family Medicine

## 2019-08-24 ENCOUNTER — Encounter: Payer: No Typology Code available for payment source | Admitting: Family Medicine

## 2019-09-23 NOTE — Progress Notes (Signed)
Patient ID: Edward Miller, male    DOB: [birth year], 24 y.o.   MRN: [id number]  PCP: Jamelle Haring, MD  Chief Complaint  Patient presents with  . STD Screening    patient would like to be screened for STD's    Subjective:   Edward Miller is a 24 y.o. male, presents to clinic with CC of the following:  Chief Complaint  Patient presents with  . STD Screening    patient would like to be screened for STD's    HPI:  Patient is a 24 year old former patient of Maurice Small Last visit with her was in February 2021 Follows up today for STI screening.   He was seen by Irving Burton in July 2020 for a chlamydia infection  tested positive on routine testing on his annual exam 3 days prior, was asymptomatic. His1 male partner did tell him  that she tested positive and had treatmment 1 week prior.  All other STI tests were negative.  He returns today for screening test for STIs. He denies any symptoms of concern, with no discharge, no rash, no pain concerns. He remains sexually active, has 2-3 partners, all male. He has had no known exposures of concern in the recent past, and wants to get tested to make sure there are no concerns.  He does use protection with 2 of his partners, does not use with the third, and states no reason except just does not use. Otherwise, he notes he is feeling well with no complaints.  There are no problems to display for this patient.     Current Outpatient Medications:  .  diclofenac (VOLTAREN) 75 MG EC tablet, Take 1 tablet (75 mg total) by mouth 2 (two) times daily., Disp: 60 tablet, Rfl: 1 .  meloxicam (MOBIC) 15 MG tablet, Take 1 tablet (15 mg total) by mouth daily. Take with food., Disp: 30 tablet, Rfl: 0 .  terbinafine (LAMISIL AT) 1 % cream, Apply 1 application topically 2 (two) times daily., Disp: 30 g, Rfl: 0   Allergies  Allergen Reactions  . Corn-Containing Products Rash  . Orange Fruit [Citrus] Rash     Past Surgical  History:  Procedure Laterality Date  . TONSILLECTOMY       Family History  Problem Relation Age of Onset  . Thyroid disease Mother      Social History   Tobacco Use  . Smoking status: Never Smoker  . Smokeless tobacco: Never Used  Substance Use Topics  . Alcohol use: Yes    With staff assistance, above reviewed with the patient today.  ROS: As per HPI, otherwise no specific complaints on a limited and focused system review   No results found for this or any previous visit (from the past 72 hour(s)).   PHQ2/9: Depression screen [facility] 2/9 09/24/2019 03/04/2019 08/24/2018 08/21/2018  Decreased Interest 1 0 0 0  Down, Depressed, Hopeless 1 0 0 0  PHQ - 2 Score 2 0 0 0  Altered sleeping 1 0 0 0  Tired, decreased energy 0 0 0 0  Change in appetite 1 0 0 0  Feeling bad or failure about yourself  0 0 0 0  Trouble concentrating 0 0 0 0  Moving slowly or fidgety/restless 0 0 0 0  Suicidal thoughts 0 0 0 0  PHQ-9 Score 4 0 0 0  Difficult doing work/chores Not difficult at all Not difficult at all Not difficult at all Not difficult at all  PHQ-2/9 Result reviewed, discussed the above with the patient, who denied any marked depression concerns  Fall Risk: Fall Risk  09/24/2019 03/04/2019 08/24/2018 08/21/2018  Falls in the past year? 0 0 0 0  Number falls in past yr: 0 0 0 0  Injury with Fall? 0 0 0 0  Follow up Falls evaluation completed - - Falls evaluation completed      Objective:   Vitals:   09/24/19 0844  BP: 104/72  Pulse: 72  Resp: 16  Temp: 98.1 F (36.7 C)  TempSrc: Oral  SpO2: 99%  Weight: 203 lb 3.2 oz (92.2 kg)  Height: 6\' 1"  (1.854 m)    Body mass index is 26.81 kg/m.  Physical Exam   NAD, masked, pleasant, looks well HEENT - [place]/AT, sclera anicteric, pharynx clear Neck - supple, no adenopathy,  Skin- no rash noted on exposed areas, patient denied otherwise GU -no discharge, no rash, no inguinal adenopathy, no testicular lumps and no tenderness when  testing Neuro/psychiatric - affect was not flat, appropriate with conversation  Alert with normal speech    Results for orders placed or performed in visit on 08/21/18  Lipid panel  Result Value Ref Range   Cholesterol 178 <200 mg/dL   HDL 52 > OR = 40 mg/dL   Triglycerides 57 08/23/18 mg/dL   LDL Cholesterol (Calc) 112 (H) mg/dL (calc)   Total CHOL/HDL Ratio 3.4 <5.0 (calc)   Non-HDL Cholesterol (Calc) 126 <130 mg/dL (calc)  COMPLETE METABOLIC PANEL WITH GFR  Result Value Ref Range   Glucose, Bld 92 65 - 99 mg/dL   BUN 15 7 - 25 mg/dL   Creat <191 4.78 - 2.95 mg/dL   GFR, Est Non African American 95 > OR = 60 mL/min/1.27m2   GFR, Est African American 110 > OR = 60 mL/min/1.29m2   BUN/Creatinine Ratio NOT APPLICABLE 6 - 22 (calc)   Sodium 140 135 - 146 mmol/L   Potassium 4.3 3.5 - 5.3 mmol/L   Chloride 104 98 - 110 mmol/L   CO2 28 20 - 32 mmol/L   Calcium 9.5 8.6 - 10.3 mg/dL   Total Protein 6.8 6.1 - 8.1 g/dL   Albumin 4.7 3.6 - 5.1 g/dL   Globulin 2.1 1.9 - 3.7 g/dL (calc)   AG Ratio 2.2 1.0 - 2.5 (calc)   Total Bilirubin 0.4 0.2 - 1.2 mg/dL   Alkaline phosphatase (APISO) 52 36 - 130 U/L   AST 15 10 - 40 U/L   ALT 13 9 - 46 U/L  HIV Antibody (routine testing w rflx)  Result Value Ref Range   HIV 1&2 Ab, 4th Generation NON-REACTIVE NON-REACTI  RPR  Result Value Ref Range   RPR Ser Ql NON-REACTIVE NON-REACTI  Hepatitis C antibody  Result Value Ref Range   Hepatitis C Ab NON-REACTIVE NON-REACTI   SIGNAL TO CUT-OFF 0.01 <1.00  Urine cytology ancillary only  Result Value Ref Range   Chlamydia **POSITIVE** (A)    Neisseria Gonorrhea Negative        Assessment & Plan:   1. Routine screening for STI (sexually transmitted infection) Discussed options for testing, and will proceed with the tests as ordered. No indication for empiric treatment before the test results are returned. Strongly encouraged him to stay safe and use protection for future activities. He is aware  if any of the tests return positive, the need to be contacting his partners for them to be evaluated and treated  - RPR - HIV Antibody (routine  testing w rflx) - Cytology (oral, anal, urethral) ancillary only  2. History of chlamydia infection Noted this in his past, and was asymptomatic at that time, although was after a positive exposure noted - Cytology (oral, anal, urethral) ancillary only   Await results presently, and if negative, will follow-up as needed over time.  If positive will be contacted with treatment recommendations.    Jamelle Haring, MD 09/24/19 8:50 AM

## 2019-09-24 ENCOUNTER — Other Ambulatory Visit (HOSPITAL_COMMUNITY)
Admission: RE | Admit: 2019-09-24 | Discharge: 2019-09-24 | Disposition: A | Discharge status: Home / Self Care | Payer: No Typology Code available for payment source | Source: Ambulatory Visit | Attending: Internal Medicine | Admitting: Internal Medicine

## 2019-09-24 ENCOUNTER — Encounter: Payer: Self-pay | Admitting: Internal Medicine

## 2019-09-24 ENCOUNTER — Other Ambulatory Visit: Payer: Self-pay

## 2019-09-24 ENCOUNTER — Ambulatory Visit (INDEPENDENT_AMBULATORY_CARE_PROVIDER_SITE_OTHER): Payer: No Typology Code available for payment source | Admitting: Internal Medicine

## 2019-09-24 VITALS — BP 104/72 | HR 72 | Temp 98.1°F | Resp 16 | Ht 73.0 in | Wt 203.2 lb

## 2019-09-24 DIAGNOSIS — Z8619 Personal history of other infectious and parasitic diseases: Secondary | ICD-10-CM | POA: Insufficient documentation

## 2019-09-24 DIAGNOSIS — Z113 Encounter for screening for infections with a predominantly sexual mode of transmission: Secondary | ICD-10-CM

## 2019-09-24 HISTORY — DX: Personal history of other infectious and parasitic diseases: Z86.19

## 2019-09-27 ENCOUNTER — Other Ambulatory Visit: Payer: Self-pay | Admitting: Internal Medicine

## 2019-09-27 DIAGNOSIS — A749 Chlamydial infection, unspecified: Secondary | ICD-10-CM

## 2019-09-27 DIAGNOSIS — Z113 Encounter for screening for infections with a predominantly sexual mode of transmission: Secondary | ICD-10-CM

## 2019-09-27 LAB — CYTOLOGY, (ORAL, ANAL, URETHRAL) ANCILLARY ONLY
Chlamydia: POSITIVE — AB
Comment: NEGATIVE
Comment: NORMAL
Neisseria Gonorrhea: NEGATIVE

## 2019-09-27 LAB — HIV ANTIBODY (ROUTINE TESTING W REFLEX): HIV 1&2 Ab, 4th Generation: NONREACTIVE

## 2019-09-27 LAB — RPR: RPR Ser Ql: NONREACTIVE

## 2019-09-27 MED ORDER — AZITHROMYCIN 250 MG PO TABS: ORAL_TABLET | ORAL | 0 refills | Status: DC

## 2019-09-27 NOTE — Progress Notes (Signed)
Patient's chlamydia returned positive, and azithromycin was prescribed for him to take. Also, noting he was asymptomatic, felt best to recheck the lab tests again in about 4 weeks time to reassess. He also was made aware to let all partners he has been within the last few weeks no as they need to be assessed and treated as well.

## 2019-09-28 NOTE — Progress Notes (Signed)
Information submitted to Health Department.

## 2019-11-05 NOTE — Progress Notes (Deleted)
Patient is a 24 year old male Last visit with me was 09/24/2019 His chlamydia test after that visit returned positive, he was treated with azithromycin-1 g  follows up today.  He was asymptomatic when he tested positive after our last visit.  His other screening tests for gonorrhea, HIV and syphilis were negative.  On follow-up today, he denies any symptoms of concern, with no discharge, no rash, no pain concerns. He remains sexually active, has 2-3 partners, all male. He has had no known exposures of concern in the recent past, and wants to get tested to make sure there are no concerns.   He does use protection with 2 of his partners, does not use with the third, and states no reason except just does not use. Otherwise, he notes he is feeling well with no complaints

## 2019-11-08 ENCOUNTER — Ambulatory Visit: Payer: No Typology Code available for payment source | Admitting: Internal Medicine

## 2019-11-16 ENCOUNTER — Other Ambulatory Visit: Payer: Self-pay

## 2019-11-16 ENCOUNTER — Ambulatory Visit (INDEPENDENT_AMBULATORY_CARE_PROVIDER_SITE_OTHER): Payer: No Typology Code available for payment source | Admitting: Internal Medicine

## 2019-11-16 ENCOUNTER — Other Ambulatory Visit (HOSPITAL_COMMUNITY)
Admission: RE | Admit: 2019-11-16 | Discharge: 2019-11-16 | Disposition: A | Discharge status: Home / Self Care | Payer: No Typology Code available for payment source | Source: Ambulatory Visit | Attending: Internal Medicine | Admitting: Internal Medicine

## 2019-11-16 ENCOUNTER — Encounter: Payer: Self-pay | Admitting: Internal Medicine

## 2019-11-16 VITALS — BP 120/64 | HR 83 | Temp 98.0°F | Resp 16 | Ht 73.0 in | Wt 201.4 lb

## 2019-11-16 DIAGNOSIS — S20229A Contusion of unspecified back wall of thorax, initial encounter: Secondary | ICD-10-CM | POA: Insufficient documentation

## 2019-11-16 DIAGNOSIS — M546 Pain in thoracic spine: Secondary | ICD-10-CM | POA: Diagnosis not present

## 2019-11-16 DIAGNOSIS — Z8619 Personal history of other infectious and parasitic diseases: Secondary | ICD-10-CM | POA: Diagnosis not present

## 2019-11-16 NOTE — Patient Instructions (Signed)
With applying ice topically to the back at 10 to 15-minute intervals as needed.

## 2019-11-16 NOTE — Progress Notes (Signed)
Patient ID: Edward Miller, male    DOB: 1996/01/26, 24 y.o.   MRN: [id number]  PCP: Jamelle Haring, MD  Chief Complaint  Patient presents with  . Back Pain  . SEXUALLY TRANSMITTED DISEASE    follow up    Subjective:   Edward Miller is a 24 y.o. male, presents to clinic with CC of the following:  Chief Complaint  Patient presents with  . Back Pain  . SEXUALLY TRANSMITTED DISEASE    follow up    HPI:    Patient is a 24 year old male Last visit with me was 09/24/2019 His chlamydia test after that visit returned positive, he was treated with azithromycin-1 g  follows up today.  He was asymptomatic when he tested positive after our last visit.  His other screening tests for gonorrhea, HIV and syphilis were negative. He denies any new partners since our last visit, and denies any symptoms of concern that have developed including no discharge or rash.  He did note he fell playing basketball on Thursday, had his legs taken out from under him and landed on his back.  He has felt pain since in the mid back region, where he landed, and notes is bothersome when he does certain movements, at times at nighttime when laying flat.  He has tried a heating pad at times.  He does think it is improved a little since this happened.  He denies any pain down his legs, no bowel or bladder symptoms of concern with no incontinence issues.  He has not yet returned to playing basketball since this occurred. He noted this during this follow-up visit today, and would likely not have made an appointment to be seen due to this otherwise.   Patient Active Problem List   Diagnosis Date Noted  . History of chlamydia infection 09/24/2019      Current Outpatient Medications:  .  diclofenac (VOLTAREN) 75 MG EC tablet, Take 1 tablet (75 mg total) by mouth 2 (two) times daily., Disp: 60 tablet, Rfl: 1 .  meloxicam (MOBIC) 15 MG tablet, Take 1 tablet (15 mg total) by mouth daily. Take with  food., Disp: 30 tablet, Rfl: 0 .  terbinafine (LAMISIL AT) 1 % cream, Apply 1 application topically 2 (two) times daily., Disp: 30 g, Rfl: 0 .  azithromycin (ZITHROMAX) 250 MG tablet, Take all 4 tablets today, can take at one time (Patient not taking: Reported on 11/16/2019), Disp: 4 tablet, Rfl: 0   Allergies  Allergen Reactions  . Corn-Containing Products Rash  . Orange Fruit [Citrus] Rash     Past Surgical History:  Procedure Laterality Date  . TONSILLECTOMY       Family History  Problem Relation Age of Onset  . Thyroid disease Mother      Social History   Tobacco Use  . Smoking status: Never Smoker  . Smokeless tobacco: Never Used  Substance Use Topics  . Alcohol use: Yes    With staff assistance, above reviewed with the patient today.  ROS: As per HPI, otherwise no specific complaints on a limited and focused system review   No results found for this or any previous visit (from the past 72 hour(s)).   PHQ2/9: Depression screen [facility] 2/9 11/16/2019 09/24/2019 03/04/2019 08/24/2018 08/21/2018  Decreased Interest 1 1 0 0 0  Down, Depressed, Hopeless 0 1 0 0 0  PHQ - 2 Score 1 2 0 0 0  Altered sleeping - 1 0 0 0  Tired, decreased energy - 0 0 0 0  Change in appetite - 1 0 0 0  Feeling bad or failure about yourself  - 0 0 0 0  Trouble concentrating - 0 0 0 0  Moving slowly or fidgety/restless - 0 0 0 0  Suicidal thoughts - 0 0 0 0  PHQ-9 Score - 4 0 0 0  Difficult doing work/chores - Not difficult at all Not difficult at all Not difficult at all Not difficult at all   PHQ-2/9 Result reviewed  Fall Risk: Fall Risk  09/24/2019 03/04/2019 08/24/2018 08/21/2018  Falls in the past year? 0 0 0 0  Number falls in past yr: 0 0 0 0  Injury with Fall? 0 0 0 0  Follow up Falls evaluation completed - - Falls evaluation completed      Objective:   Vitals:   11/16/19 1102  BP: 120/64  Pulse: 83  Resp: 16  Temp: 98 F (36.7 C)  TempSrc: Oral  SpO2: 98%  Weight: 201 lb  6.4 oz (91.4 kg)  Height: 6\' 1"  (1.854 m)    Body mass index is 26.57 kg/m.  Physical Exam   NAD, masked, pleasant HEENT - [place]/AT, sclera anicteric, conjunctiva noninjected Neck - supple,  Car - RRR, not tachycardic Abd - soft, NT, ND, no masses Back - no CVA tenderness, minimally tender in the lower thoracic region centrally when palpating over the spine and immediately adjacent to the spine bilaterally, no focal area over a spinous process that is painful, no bruising, erythema, or localized swelling noted, could reach approximately ankle level bending to touch his toes before noted some mild discomfort, no pain with hyperextension.  Nontender in the lumbosacral spine region and no tenderness in the muscle beds adjacent. Straight leg raise was negative bilaterally with no discomfort with straight leg raise testing, and no pain when supine bringing his knee to his chest bilaterally. Excellent leg strength, and sensation intact to light touch in the lower extremities Ext - no LE edema,  Neuro/psychiatric - affect was not flat, appropriate with conversation  Alert and oriented  Grossly non-focal - good strength on testing lower extremities, sensation intact to LT in distal lower extremities, DTRs 2+ and equal in the patella and Achilles  Speech normal   Results for orders placed or performed in visit on 09/24/19  RPR  Result Value Ref Range   RPR Ser Ql NON-REACTIVE NON-REACTI  HIV Antibody (routine testing w rflx)  Result Value Ref Range   HIV 1&2 Ab, 4th Generation NON-REACTIVE NON-REACTI  Cytology (oral, anal, urethral) ancillary only  Result Value Ref Range   Neisseria Gonorrhea Negative    Chlamydia Positive (A)    Comment Normal Reference [place] Chlamydia - Negative    Comment      Normal Reference Range Neisseria Gonorrhea - Negative   Last labs reviewed    Assessment & Plan:   1. History of chlamydia infection Was asymptomatic when checked previously, and did take an  azithromycin course after. Felt best to recheck the urine today for chlamydia, and noted if does return positive, the recommendations of a different antibiotic, doxycycline to treat.  That is now the preferred treatment, and will use twice daily for 7 days. If negative, no further treatment recommendations are needed. He did have the HIV and RPR test completed last visit and were nonreactive.  - Urine cytology ancillary only  2. Acute thoracic back pain, unspecified back pain laterality 3. Contusion of back,  unspecified laterality, initial encounter Noted a likely contusion as the source of his back pain which is improving some over time.  Discussed potentially getting an x-ray to ensure there is no fracture issue, and agreed to hold off on that presently, although symptoms not improving or more problematic over the next 1 to 2 weeks, does need to follow-up and will need to get an x-ray at that time. Recommended ice topically more than heat in the short-term, at 10 to 15-minute intervals. Also can use an ibuprofen or Aleve type product as needed in the short-term, although would not use routinely. Await his response.   We will have the labs back tomorrow to review, and await those results presently.    Jamelle Haring, MD 11/16/19 11:20 AM

## 2019-11-17 LAB — URINE CYTOLOGY ANCILLARY ONLY
Chlamydia: NEGATIVE
Comment: NEGATIVE

## 2020-09-19 ENCOUNTER — Encounter: Payer: No Typology Code available for payment source | Admitting: Family Medicine

## 2020-10-12 ENCOUNTER — Encounter: Payer: Self-pay | Admitting: Family Medicine

## 2020-10-12 ENCOUNTER — Other Ambulatory Visit: Payer: Self-pay

## 2020-10-12 ENCOUNTER — Ambulatory Visit (INDEPENDENT_AMBULATORY_CARE_PROVIDER_SITE_OTHER): Payer: 59 | Admitting: Family Medicine

## 2020-10-12 ENCOUNTER — Other Ambulatory Visit (HOSPITAL_COMMUNITY)
Admission: RE | Admit: 2020-10-12 | Discharge: 2020-10-12 | Disposition: A | Discharge status: Home / Self Care | Payer: 59 | Source: Ambulatory Visit | Attending: Family Medicine | Admitting: Family Medicine

## 2020-10-12 VITALS — BP 120/70 | HR 76 | Temp 98.1°F | Resp 16 | Ht 73.0 in | Wt 192.0 lb

## 2020-10-12 DIAGNOSIS — Z23 Encounter for immunization: Secondary | ICD-10-CM

## 2020-10-12 DIAGNOSIS — L02219 Cutaneous abscess of trunk, unspecified: Secondary | ICD-10-CM | POA: Diagnosis not present

## 2020-10-12 DIAGNOSIS — L739 Follicular disorder, unspecified: Secondary | ICD-10-CM

## 2020-10-12 DIAGNOSIS — Z113 Encounter for screening for infections with a predominantly sexual mode of transmission: Secondary | ICD-10-CM | POA: Diagnosis not present

## 2020-10-12 MED ORDER — SULFAMETHOXAZOLE-TRIMETHOPRIM 800-160 MG PO TABS
1.0000 | ORAL_TABLET | Freq: Two times a day (BID) | ORAL | 0 refills | Status: DC
  Filled 2020-10-12: qty 14, 7d supply, fill #0

## 2020-10-12 NOTE — Progress Notes (Signed)
Name: Edward Miller   MRN: [id number]    DOB: 1995/04/21   Date:10/12/2020       Progress Note  Subjective  Chief Complaint  Consult  HPI  Bumps on pubic area: he has ben sexually active with same male sexual partner for the past two months, he shaved his pubic area about a week ago and noticed some bumps on the pubic area, one of the area has gotten swollen and tender over the past 4 days, and has been draining puss when he takes a shower. He never had this before. He denies fever, chills , nausea , vomiting or lymphadenopathy.   Patient Active Problem List   Diagnosis Date Noted   Contusion of back 11/16/2019   History of chlamydia infection 09/24/2019    Past Surgical History:  Procedure Laterality Date   TONSILLECTOMY      Family History  Problem Relation Age of Onset   Thyroid disease Mother     Social History   Tobacco Use   Smoking status: Never   Smokeless tobacco: Never  Substance Use Topics   Alcohol use: Yes     Current Outpatient Medications:    sulfamethoxazole-trimethoprim (BACTRIM DS) 800-160 MG tablet, Take 1 tablet by mouth 2 (two) times daily., Disp: 14 tablet, Rfl: 0  Allergies  Allergen Reactions   Corn-Containing Products Rash   Orange Fruit [Citrus] Rash    I personally reviewed active problem list, medication list, allergies, family history, social history, health maintenance with the patient/caregiver today.   ROS  Ten systems reviewed and is negative except as mentioned in HPI   Objective  Vitals:   10/12/20 0840  BP: 120/70  Pulse: 76  Resp: 16  Temp: 98.1 F (36.7 C)  SpO2: 97%  Weight: 192 lb (87.1 kg)  Height: 6\' 1"  (1.854 m)    Body mass index is 25.33 kg/m.  Physical Exam  Constitutional: Patient appears well-developed and well-nourished.  No distress.  HEENT: head atraumatic, normocephalic, pupils equal and reactive to light, neck supple Cardiovascular: Normal rate, regular rhythm and normal heart sounds.   No murmur heard. No BLE edema. Pulmonary/Chest: Effort normal and breath sounds normal. No respiratory distress. Abdominal: Soft.  There is no tenderness. Skin: multiple supra pubic pustules, one of the infected hair follicle is swollen about 2 x2 cm in size and draining bloody/purulent material Psychiatric: Patient has a normal mood and affect. behavior is normal. Judgment and thought content normal.    PHQ2/9: Depression screen [facility] 2/9 10/12/2020 11/16/2019 09/24/2019 03/04/2019 08/24/2018  Decreased Interest 0 1 1 0 0  Down, Depressed, Hopeless 0 0 1 0 0  PHQ - 2 Score 0 1 2 0 0  Altered sleeping - - 1 0 0  Tired, decreased energy - - 0 0 0  Change in appetite - - 1 0 0  Feeling bad or failure about yourself  - - 0 0 0  Trouble concentrating - - 0 0 0  Moving slowly or fidgety/restless - - 0 0 0  Suicidal thoughts - - 0 0 0  PHQ-9 Score - - 4 0 0  Difficult doing work/chores - - Not difficult at all Not difficult at all Not difficult at all    phq 9 is negative   Fall Risk: Fall Risk  10/12/2020 09/24/2019 03/04/2019 08/24/2018 08/21/2018  Falls in the past year? 0 0 0 0 0  Number falls in past yr: 0 0 0 0 0  Injury with Fall? 0  0 0 0 0  Risk for fall due to : No Fall Risks - - - -  Follow up Falls prevention discussed Falls evaluation completed - - Falls evaluation completed      Functional Status Survey: Is the patient deaf or have difficulty hearing?: No Does the patient have difficulty seeing, even when wearing glasses/contacts?: No Does the patient have difficulty concentrating, remembering, or making decisions?: No Does the patient have difficulty walking or climbing stairs?: No Does the patient have difficulty dressing or bathing?: No Does the patient have difficulty doing errands alone such as visiting a doctor's office or shopping?: No    Assessment & Plan  1. Acute folliculitis  - sulfamethoxazole-trimethoprim (BACTRIM DS) 800-160 MG tablet; Take 1 tablet by mouth  2 (two) times daily.  Dispense: 14 tablet; Refill: 0  2. Abscess of pubic region  - sulfamethoxazole-trimethoprim (BACTRIM DS) 800-160 MG tablet; Take 1 tablet by mouth 2 (two) times daily.  Dispense: 14 tablet; Refill: 0  3. Needs flu shot  Refused   4. Routine screening for STI (sexually transmitted infection)  - Hepatitis C antibody - HIV Antibody (routine testing w rflx) - RPR - Urine cytology ancillary only

## 2020-10-13 LAB — RPR: RPR Ser Ql: NONREACTIVE

## 2020-10-13 LAB — HEPATITIS C ANTIBODY
Hepatitis C Ab: NONREACTIVE
SIGNAL TO CUT-OFF: 0.01 (ref <1.00)

## 2020-10-13 LAB — HIV ANTIBODY (ROUTINE TESTING W REFLEX): HIV 1&2 Ab, 4th Generation: NONREACTIVE

## 2020-10-16 LAB — URINE CYTOLOGY ANCILLARY ONLY
Chlamydia: NEGATIVE
Comment: NEGATIVE
Comment: NORMAL
Neisseria Gonorrhea: NEGATIVE

## 2020-10-17 ENCOUNTER — Other Ambulatory Visit: Payer: Self-pay

## 2020-10-18 LAB — ANAEROBIC AND AEROBIC CULTURE
MICRO NUMBER:: 12382030
MICRO NUMBER:: 12382031
SPECIMEN QUALITY:: ADEQUATE
SPECIMEN QUALITY:: ADEQUATE

## 2021-06-07 ENCOUNTER — Ambulatory Visit: Payer: 59 | Admitting: Family Medicine

## 2021-06-07 NOTE — Progress Notes (Deleted)
Name: Edward Miller   MRN: [id number]    DOB: 12/21/1995   Date:06/07/2021       Progress Note  Subjective  Chief Complaint  Testing  HPI  *** Patient Active Problem List   Diagnosis Date Noted   Contusion of back 11/16/2019   History of chlamydia infection 09/24/2019    Past Surgical History:  Procedure Laterality Date   TONSILLECTOMY      Family History  Problem Relation Age of Onset   Thyroid disease Mother     Social History   Tobacco Use   Smoking status: Never   Smokeless tobacco: Never  Substance Use Topics   Alcohol use: Yes     Current Outpatient Medications:    sulfamethoxazole-trimethoprim (BACTRIM DS) 800-160 MG tablet, Take 1 tablet by mouth 2 (two) times daily., Disp: 14 tablet, Rfl: 0  Allergies  Allergen Reactions   Corn-Containing Products Rash   Orange Fruit [Citrus] Rash    I personally reviewed active problem list, medication list, allergies, family history, social history, health maintenance with the patient/caregiver today.   ROS  ***  Objective  There were no vitals filed for this visit.  There is no height or weight on file to calculate BMI.  Physical Exam ***  No results found for this or any previous visit (from the past 2160 hour(s)).   PHQ2/9:    10/12/2020    8:41 AM 11/16/2019   11:04 AM 09/24/2019    8:48 AM 03/04/2019   12:25 PM 08/24/2018    1:37 PM  Depression screen PHQ 2/9  Decreased Interest 0 1 1 0 0  Down, Depressed, Hopeless 0 0 1 0 0  PHQ - 2 Score 0 1 2 0 0  Altered sleeping   1 0 0  Tired, decreased energy   0 0 0  Change in appetite   1 0 0  Feeling bad or failure about yourself    0 0 0  Trouble concentrating   0 0 0  Moving slowly or fidgety/restless   0 0 0  Suicidal thoughts   0 0 0  PHQ-9 Score   4 0 0  Difficult doing work/chores   Not difficult at all Not difficult at all Not difficult at all    phq 9 is {gen pos [id number]}   Fall Risk:    10/12/2020    8:41 AM 09/24/2019     8:48 AM 03/04/2019   12:24 PM 08/24/2018    1:37 PM 08/21/2018    1:48 PM  Fall Risk   Falls in the past year? 0 0 0 0 0  Number falls in past yr: 0 0 0 0 0  Injury with Fall? 0 0 0 0 0  Risk for fall due to : No Fall Risks      Follow up Falls prevention discussed Falls evaluation completed   Falls evaluation completed      Functional Status Survey:      Assessment & Plan  *** There are no diagnoses linked to this encounter.

## 2021-06-11 NOTE — Progress Notes (Deleted)
Name: Edward Miller   MRN: [id number]    DOB: 13-Nov-1995   Date:06/11/2021       Progress Note  Subjective  Chief Complaint  Acute visit for STD testing  HPI  *** Patient Active Problem List   Diagnosis Date Noted   Contusion of back 11/16/2019   History of chlamydia infection 09/24/2019    Past Surgical History:  Procedure Laterality Date   TONSILLECTOMY      Family History  Problem Relation Age of Onset   Thyroid disease Mother     Social History   Tobacco Use   Smoking status: Never   Smokeless tobacco: Never  Substance Use Topics   Alcohol use: Yes     Current Outpatient Medications:    sulfamethoxazole-trimethoprim (BACTRIM DS) 800-160 MG tablet, Take 1 tablet by mouth 2 (two) times daily., Disp: 14 tablet, Rfl: 0  Allergies  Allergen Reactions   Corn-Containing Products Rash   Orange Fruit [Citrus] Rash    I personally reviewed {Reviewed:14835} with the patient/caregiver today.   ROS  ***  Objective  There were no vitals filed for this visit.  There is no height or weight on file to calculate BMI.  Physical Exam ***  No results found for this or any previous visit (from the past 2160 hour(s)).  Diabetic Foot Exam: Diabetic Foot Exam - Simple   No data filed    ***  PHQ2/9:    10/12/2020    8:41 AM 11/16/2019   11:04 AM 09/24/2019    8:48 AM 03/04/2019   12:25 PM 08/24/2018    1:37 PM  Depression screen PHQ 2/9  Decreased Interest 0 1 1 0 0  Down, Depressed, Hopeless 0 0 1 0 0  PHQ - 2 Score 0 1 2 0 0  Altered sleeping   1 0 0  Tired, decreased energy   0 0 0  Change in appetite   1 0 0  Feeling bad or failure about yourself    0 0 0  Trouble concentrating   0 0 0  Moving slowly or fidgety/restless   0 0 0  Suicidal thoughts   0 0 0  PHQ-9 Score   4 0 0  Difficult doing work/chores   Not difficult at all Not difficult at all Not difficult at all    phq 9 is {gen pos RSW:[id number]} ***  Fall Risk:    10/12/2020    8:41 AM  09/24/2019    8:48 AM 03/04/2019   12:24 PM 08/24/2018    1:37 PM 08/21/2018    1:48 PM  Fall Risk   Falls in the past year? 0 0 0 0 0  Number falls in past yr: 0 0 0 0 0  Injury with Fall? 0 0 0 0 0  Risk for fall due to : No Fall Risks      Follow up Falls prevention discussed Falls evaluation completed   Falls evaluation completed   ***   Functional Status Survey:   ***   Assessment & Plan  *** There are no diagnoses linked to this encounter.

## 2021-06-12 ENCOUNTER — Ambulatory Visit: Payer: 59 | Admitting: Family Medicine

## 2021-12-05 ENCOUNTER — Other Ambulatory Visit (HOSPITAL_COMMUNITY)
Admission: RE | Admit: 2021-12-05 | Discharge: 2021-12-05 | Disposition: A | Discharge status: Home / Self Care | Payer: 59 | Source: Ambulatory Visit | Attending: Family Medicine | Admitting: Family Medicine

## 2021-12-05 ENCOUNTER — Ambulatory Visit (INDEPENDENT_AMBULATORY_CARE_PROVIDER_SITE_OTHER): Payer: 59 | Admitting: Family Medicine

## 2021-12-05 ENCOUNTER — Encounter: Payer: Self-pay | Admitting: Family Medicine

## 2021-12-05 VITALS — BP 134/70 | HR 84 | Temp 98.4°F | Resp 16 | Ht 72.0 in | Wt 193.6 lb

## 2021-12-05 DIAGNOSIS — Z7251 High risk heterosexual behavior: Secondary | ICD-10-CM | POA: Diagnosis not present

## 2021-12-05 DIAGNOSIS — Z113 Encounter for screening for infections with a predominantly sexual mode of transmission: Secondary | ICD-10-CM | POA: Diagnosis not present

## 2021-12-05 DIAGNOSIS — Z23 Encounter for immunization: Secondary | ICD-10-CM | POA: Diagnosis not present

## 2021-12-05 NOTE — Progress Notes (Signed)
Patient ID: KOAL ESLINGER, male    DOB: 1995-06-13, 26 y.o.   MRN: [id number]  PCP: Danelle Berry, PA-C  Chief Complaint  Patient presents with   STD Testing    Pt would like to do his yearly testing, denies exposure    Subjective:   Edward Miller is a 26 y.o. male, presents to clinic with CC of the following:  HPI   Here for STD screening, no symptoms Possibly 4 male sexual partners in the past 6 months Sometimes without a condom, he has not been told he's been exposed to any STDs, partners probably have other sexual partners Hx of chlamydia a few years ago, tries to get tested about every 6 months, last CPE and other baseline labs were about 3+ years ago   Patient Active Problem List   Diagnosis Date Noted   Contusion of back 11/16/2019   History of chlamydia infection 09/24/2019      Current Outpatient Medications:    sulfamethoxazole-trimethoprim (BACTRIM DS) 800-160 MG tablet, Take 1 tablet by mouth 2 (two) times daily., Disp: 14 tablet, Rfl: 0   Allergies  Allergen Reactions   Corn-Containing Products Rash   Orange Fruit [Citrus] Rash     Social History   Tobacco Use   Smoking status: Never   Smokeless tobacco: Never  Vaping Use   Vaping Use: Never used  Substance Use Topics   Alcohol use: Yes   Drug use: Yes    Types: Marijuana      Chart Review Today: I personally reviewed active problem list, medication list, allergies, family history, social history, health maintenance, notes from last encounter, lab results, imaging with the patient/caregiver today.   Review of Systems  Constitutional: Negative.   HENT: Negative.    Eyes: Negative.   Respiratory: Negative.    Cardiovascular: Negative.   Gastrointestinal: Negative.  Negative for abdominal pain.  Endocrine: Negative.   Genitourinary: Negative.  Negative for decreased urine volume, difficulty urinating, dysuria, flank pain, frequency, genital sores, hematuria, penile discharge,  penile pain, penile swelling, scrotal swelling, testicular pain and urgency.  Musculoskeletal: Negative.   Skin: Negative.   Allergic/Immunologic: Negative.   Neurological: Negative.   Hematological: Negative.  Negative for adenopathy.  Psychiatric/Behavioral: Negative.    All other systems reviewed and are negative.      Objective:   Vitals:   12/05/21 1326  BP: 134/70  Pulse: 84  Resp: 16  Temp: 98.4 F (36.9 C)  TempSrc: Oral  SpO2: 98%  Weight: 193 lb 9.6 oz (87.8 kg)  Height: 6' (1.829 m)    Body mass index is 26.26 kg/m.  Physical Exam Vitals and nursing note reviewed.  Constitutional:      General: He is not in acute distress.    Appearance: Normal appearance. He is well-developed and normal weight. He is not ill-appearing, toxic-appearing or diaphoretic.     Comments: Well appearing young man  HENT:     Head: Normocephalic and atraumatic.     Right Ear: External ear normal.     Left Ear: External ear normal.     Nose: Nose normal.  Eyes:     General: No scleral icterus.       Right eye: No discharge.        Left eye: No discharge.     Conjunctiva/sclera: Conjunctivae normal.  Neck:     Trachea: No tracheal deviation.  Cardiovascular:     Rate and Rhythm: Normal rate and regular rhythm.  Pulses: Normal pulses.     Heart sounds: Normal heart sounds. No murmur heard.    No friction rub. No gallop.  Pulmonary:     Effort: Pulmonary effort is normal. No respiratory distress.     Breath sounds: Normal breath sounds. No stridor.  Abdominal:     General: Bowel sounds are normal. There is no distension.     Palpations: Abdomen is soft.  Skin:    General: Skin is warm and dry.     Findings: No rash.  Neurological:     Mental Status: He is alert.     Motor: No abnormal muscle tone.     Coordination: Coordination normal.  Psychiatric:        Mood and Affect: Mood normal.        Behavior: Behavior normal.      Results for orders placed or performed  in visit on 10/12/20  Anaerobic and Aerobic Culture   Specimen: Groin; Abscess  Result Value Ref Range   MICRO NUMBER: [id number]    SPECIMEN QUALITY: Adequate    Source: ABSCESS    STATUS: FINAL    GRAM STAIN:      No epithelial cells seen No organisms or white blood cells seen   ANA RESULT: No anaerobes isolated.    MICRO NUMBER: [id number]    SPECIMEN QUALITY: Adequate    SOURCE: GROIN    STATUS: FINAL    AER ISOLATE 1: Streptococcus pyogenes (A)   Hepatitis C antibody  Result Value Ref Range   Hepatitis C Ab NON-REACTIVE NON-REACTIVE   SIGNAL TO CUT-OFF 0.01 <1.00  HIV Antibody (routine testing w rflx)  Result Value Ref Range   HIV 1&2 Ab, 4th Generation NON-REACTIVE NON-REACTIVE  RPR  Result Value Ref Range   RPR Ser Ql NON-REACTIVE NON-REACTIVE  Urine cytology ancillary only  Result Value Ref Range   Neisseria Gonorrhea Negative    Chlamydia Negative    Comment Normal Reference Ranger Chlamydia - Negative    Comment      Normal Reference Range Neisseria Gonorrhea - Negative       Assessment & Plan:       ICD-10-CM   1. Screening for STD (sexually transmitted disease)  Z11.3 Urine cytology ancillary only    RPR    HIV Antibody (routine testing w rflx)   4 male sexual partners estimated in the past 6 months, asx, wishes for screening today    2. High risk heterosexual behavior  Z72.51 Urine cytology ancillary only    RPR    HIV Antibody (routine testing w rflx)   counseled on safe sex practices    3. Need for influenza vaccination  Z23 Flu Vaccine QUAD 65mo+IM (Fluarix, Fluzone & Alfiuria Quad PF)     Pt encouraged to come back when needed with any known STD exposure or for repeated testing, educated on safer sex practices, educated about STD sx/briefly touched on treatments if needed We will call him to results and anything positive will be treated and at that time would likely need discussion on closer f/up testing/retesting  Return in about 6 months  (around 06/05/2022) for Annual Physical.   Danelle Berry, PA-C 12/05/21 1:48 PM

## 2021-12-06 LAB — HIV ANTIBODY (ROUTINE TESTING W REFLEX): HIV 1&2 Ab, 4th Generation: NONREACTIVE

## 2021-12-06 LAB — RPR: RPR Ser Ql: NONREACTIVE

## 2021-12-07 LAB — URINE CYTOLOGY ANCILLARY ONLY
Chlamydia: NEGATIVE
Comment: NEGATIVE
Comment: NEGATIVE
Comment: NORMAL
Neisseria Gonorrhea: NEGATIVE
Trichomonas: NEGATIVE

## 2022-04-23 ENCOUNTER — Ambulatory Visit: Payer: 59 | Admitting: Family Medicine

## 2022-04-23 DIAGNOSIS — Z0000 Encounter for general adult medical examination without abnormal findings: Secondary | ICD-10-CM

## 2022-04-25 ENCOUNTER — Ambulatory Visit (INDEPENDENT_AMBULATORY_CARE_PROVIDER_SITE_OTHER): Payer: 59 | Admitting: Family Medicine

## 2022-04-25 ENCOUNTER — Encounter: Payer: Self-pay | Admitting: Family Medicine

## 2022-04-25 ENCOUNTER — Other Ambulatory Visit (HOSPITAL_COMMUNITY)
Admission: RE | Admit: 2022-04-25 | Discharge: 2022-04-25 | Disposition: A | Discharge status: Home / Self Care | Payer: 59 | Source: Ambulatory Visit | Attending: Family Medicine | Admitting: Family Medicine

## 2022-04-25 VITALS — BP 108/70 | HR 95 | Temp 98.5°F | Resp 16 | Ht 72.5 in | Wt 207.4 lb

## 2022-04-25 DIAGNOSIS — R21 Rash and other nonspecific skin eruption: Secondary | ICD-10-CM

## 2022-04-25 DIAGNOSIS — Z7251 High risk heterosexual behavior: Secondary | ICD-10-CM

## 2022-04-25 DIAGNOSIS — Z0000 Encounter for general adult medical examination without abnormal findings: Secondary | ICD-10-CM | POA: Diagnosis not present

## 2022-04-25 DIAGNOSIS — Z113 Encounter for screening for infections with a predominantly sexual mode of transmission: Secondary | ICD-10-CM

## 2022-04-25 LAB — CBC WITH DIFFERENTIAL/PLATELET
Basophils Relative: 0.8 %
Eosinophils Absolute: 30 cells/uL (ref 15–500)
Eosinophils Relative: 0.8 %
HCT: 45.2 % (ref 38.5–50.0)
Hemoglobin: 15.6 g/dL (ref 13.2–17.1)
Lymphs Abs: 1491 cells/uL (ref 850–3900)
MCH: 30.1 pg (ref 27.0–33.0)
MCHC: 34.5 g/dL (ref 32.0–36.0)
MCV: 87.1 fL (ref 80.0–100.0)
MPV: 11 fL (ref 7.5–12.5)
Monocytes Relative: 7.3 %
RBC: 5.19 10*6/uL (ref 4.20–5.80)
RDW: 12.2 % (ref 11.0–15.0)
WBC: 3.7 10*3/uL — ABNORMAL LOW (ref 3.8–10.8)

## 2022-04-25 NOTE — Patient Instructions (Signed)
Preventive Care 21-25 Years Old, Male Preventive care refers to lifestyle choices and visits with your health care provider that can promote health and wellness. Preventive care visits are also called wellness exams. What can I expect for my preventive care visit? Counseling During your preventive care visit, your health care provider may ask about your: Medical history, including: Past medical problems. Family medical history. Current health, including: Emotional well-being. Home life and relationship well-being. Sexual activity. Lifestyle, including: Alcohol, nicotine or tobacco, and drug use. Access to firearms. Diet, exercise, and sleep habits. Safety issues such as seatbelt and bike helmet use. Sunscreen use. Work and work environment. Physical exam Your health care provider may check your: Height and weight. These may be used to calculate your BMI (body mass index). BMI is a measurement that tells if you are at a healthy weight. Waist circumference. This measures the distance around your waistline. This measurement also tells if you are at a healthy weight and may help predict your risk of certain diseases, such as type 2 diabetes and high blood pressure. Heart rate and blood pressure. Body temperature. Skin for abnormal spots. What immunizations do I need?  Vaccines are usually given at various ages, according to a schedule. Your health care provider will recommend vaccines for you based on your age, medical history, and lifestyle or other factors, such as travel or where you work. What tests do I need? Screening Your health care provider may recommend screening tests for certain conditions. This may include: Lipid and cholesterol levels. Diabetes screening. This is done by checking your blood sugar (glucose) after you have not eaten for a while (fasting). Hepatitis B test. Hepatitis C test. HIV (human immunodeficiency virus) test. STI (sexually transmitted infection)  testing, if you are at risk. Talk with your health care provider about your test results, treatment options, and if necessary, the need for more tests. Follow these instructions at home: Eating and drinking  Eat a healthy diet that includes fresh fruits and vegetables, whole grains, lean protein, and low-fat dairy products. Drink enough fluid to keep your urine pale yellow. Take vitamin and mineral supplements as recommended by your health care provider. Do not drink alcohol if your health care provider tells you not to drink. If you drink alcohol: Limit how much you have to 0-2 drinks a day. Know how much alcohol is in your drink. In the [place] one drink equals one 12 oz bottle of beer (355 mL), one 5 oz glass of wine (148 mL), or one 1 oz glass of hard liquor (44 mL). Lifestyle Brush your teeth every morning and night with fluoride toothpaste. Floss one time each day. Exercise for at least 30 minutes 5 or more days each week. Do not use any products that contain nicotine or tobacco. These products include cigarettes, chewing tobacco, and vaping devices, such as e-cigarettes. If you need help quitting, ask your health care provider. Do not use drugs. If you are sexually active, practice safe sex. Use a condom or other form of protection to prevent STIs. Find healthy ways to manage stress, such as: Meditation, yoga, or listening to music. Journaling. Talking to a trusted person. Spending time with friends and family. Minimize exposure to UV radiation to reduce your risk of skin cancer. Safety Always wear your seat belt while driving or riding in a vehicle. Do not drive: If you have been drinking alcohol. Do not ride with someone who has been drinking. If you have been using any mind-altering substances   or drugs. While texting. When you are tired or distracted. Wear a helmet and other protective equipment during sports activities. If you have firearms in your house, make sure you  follow all gun safety procedures. Seek help if you have been physically or sexually abused. What's next? Go to your health care provider once a year for an annual wellness visit. Ask your health care provider how often you should have your eyes and teeth checked. Stay up to date on all vaccines. This information is not intended to replace advice given to you by your health care provider. Make sure you discuss any questions you have with your health care provider. Document Revised: 07/12/2020 Document Reviewed: 07/12/2020 [employer] Patient Education  2023 [employer]  

## 2022-04-25 NOTE — Progress Notes (Signed)
Patient: Edward Miller, Male    DOB: 03-29-1995, 27 y.o.   MRN: [id number] Delsa Grana, PA-C Visit Date: 04/25/2022  Today's Provider: Delsa Grana, PA-C   Chief Complaint  Patient presents with   Annual Exam   std testing   Subjective:   Annual physical exam:  Edward Miller is a 27 y.o. male who presents today for health maintenance and annual & complete physical exam.   Exercise/Activity:  started going to the gym Diet/nutrition:  in the past 2 weeks no fast food no alcohol  no sweets no bread Sleep:   sleeps well      Alcohol - 5 n/week drink until liquor is gone, for the past two weeks he has stopped drinking, it was making him feel bad Complete AUDIT screening done - [place] Office Visit from 04/25/2022 in [facility]  AUDIT-C Score 10     Counseled on moderate alcohol use and discussed abuse, dependence, alcoholism, SE and risk of etoh, commended for stopping , no withdrawal sx   SDOH Screenings   Food Insecurity: No Food Insecurity (04/25/2022)  Housing: Low Risk  (04/25/2022)  Transportation Needs: No Transportation Needs (04/25/2022)  Utilities: Not At Risk (04/25/2022)  Alcohol Screen: Low Risk  (04/25/2022)  Depression (PHQ2-9): Low Risk  (04/25/2022)  Financial Resource Strain: Low Risk  (04/25/2022)  Physical Activity: Sufficiently Active (04/25/2022)  Social Connections: Socially Isolated (04/25/2022)  Stress: No Stress Concern Present (04/25/2022)  Tobacco Use: Low Risk  (04/25/2022)    USPSTF grade A and B recommendations - reviewed and addressed today  Depression:  Phq 9 completed today by patient, was reviewed by me with patient in the room, score is  negative, pt feels good    04/25/2022    2:27 PM 12/05/2021    1:26 PM 10/12/2020    8:41 AM  Depression screen PHQ 2/9  Decreased Interest 0 1 0  Down, Depressed, Hopeless 0 0 0  PHQ - 2 Score 0 1 0  Altered sleeping 0 0   Tired, decreased energy 0 0   Change in  appetite 0 0   Feeling bad or failure about yourself  0 0   Trouble concentrating 0 0   Moving slowly or fidgety/restless 0 0   Suicidal thoughts 0 0   PHQ-9 Score 0 1   Difficult doing work/chores Not difficult at all Not difficult at all     Hep C Screening: done  STD testing and prevention (HIV/chl/gon/syphilis): 2 partners in the past 6 months , would like screening High risk sexual behavior with past hx of chlamydia, he is currently asx  Intimate partner violence:safe at home Lives with aunt lives with cousins  Advanced Care Planning:  A voluntary discussion about advance care planning including the explanation and discussion of advance directives.   Health Maintenance  Topic Date Due   COVID-19 Vaccine (1) 05/11/2022 (Originally 03/06/1998)   DTaP/Tdap/Td (8 - Td or Tdap) 08/20/2028   INFLUENZA VACCINE  Completed   HPV VACCINES  Completed   Hepatitis C Screening  Completed   HIV Screening  Completed    Skin cancer:   Pt reports no hx of skin cancer, suspicious lesions/biopsies in the past. Rash would like derm consult for chronic rash on arms  Colorectal cancer:  colonoscopy is not due per age   Pt denies change in bowels, blood in stool  Prostate cancer: n/a per age Prostate cancer screening with PSA: Discussed risks and  benefits of PSA testing and provided handout.  No results found for: "PSA"  Urinary Symptoms:  none  Lung cancer:  n/ Low Dose CT Chest recommended if Age 74-80 years, 20 pack-year currently smoking OR have quit w/in 15years. Patient does not qualify.   Social History   Tobacco Use   Smoking status: Never   Smokeless tobacco: Never  Substance Use Topics   Alcohol use: Not Currently     Alcohol screening: [place] Office Visit from 04/25/2022 in [facility]  AUDIT-C Score 10       AAA:  The USPSTF recommends one-time screening with ultrasonography in men ages 10 to 36 years who have ever smoked  [id number]  indicated  Blood pressure/Hypertension: BP Readings from Last 3 Encounters:  04/25/22 108/70  12/05/21 134/70  10/12/20 120/70   Weight/Obesity: Wt Readings from Last 3 Encounters:  04/25/22 207 lb 6.4 oz (94.1 kg)  12/05/21 193 lb 9.6 oz (87.8 kg)  10/12/20 192 lb (87.1 kg)   BMI Readings from Last 3 Encounters:  04/25/22 27.74 kg/m  12/05/21 26.26 kg/m  10/12/20 25.33 kg/m    Lipids:  Lab Results  Component Value Date   CHOL 178 08/21/2018   Lab Results  Component Value Date   HDL 52 08/21/2018   Lab Results  Component Value Date   LDLCALC 112 (H) 08/21/2018   Lab Results  Component Value Date   TRIG 57 08/21/2018   Lab Results  Component Value Date   CHOLHDL 3.4 08/21/2018   No results found for: "LDLDIRECT" Based on the results of lipid panel his/her cardiovascular risk factor ( using [place] )  in the next 10 years is : The ASCVD Risk score (Arnett DK, et al., 2019) failed to calculate for the following reasons:   The 2019 ASCVD risk score is only valid for ages 13 to 37 Glucose:  Glucose, Bld  Date Value Ref Range Status  08/21/2018 92 65 - 99 mg/dL Final    Comment:    .            Fasting reference interval .     Social History       Social History   Socioeconomic History   Marital status: Single    Spouse name: Not on file   Number of children: 0   Years of education: 13   Highest education level: 12th grade  Occupational History   Occupation: unemployed  Tobacco Use   Smoking status: Never   Smokeless tobacco: Never  Vaping Use   Vaping Use: Never used  Substance and Sexual Activity   Alcohol use: Not Currently   Drug use: Yes    Types: Marijuana   Sexual activity: Yes    Partners: Female    Birth control/protection: Condom    Comment: not every time  Other Topics Concern   Not on file  Social History Narrative   Not on file   Social Determinants of Health   Financial Resource Strain: Low Risk  (04/25/2022)    Overall Financial Resource Strain (CARDIA)    Difficulty of Paying Living Expenses: Not hard at all  Food Insecurity: No Food Insecurity (04/25/2022)   Hunger Vital Sign    Worried About Running Out of Food in the Last Year: Never true    [place] in the Last Year: Never true  Transportation Needs: No Transportation Needs (04/25/2022)   PRAPARE - Transportation    Lack of Transportation (  Medical): No    Lack of Transportation (Non-Medical): No  Physical Activity: Sufficiently Active (04/25/2022)   Exercise Vital Sign    Days of Exercise per Week: 3 days    Minutes of Exercise per Session: 60 min  Stress: No Stress Concern Present (04/25/2022)   [place]    Feeling of Stress : Only a little  Social Connections: Socially Isolated (04/25/2022)   Social Connection and Isolation Panel [NHANES]    Frequency of Communication with Friends and Family: More than three times a week    Frequency of Social Gatherings with Friends and Family: More than three times a week    Attends Religious Services: Never    Marine scientist or Organizations: No    Attends Music therapist: Never    Marital Status: Never married    Family History        Family History  Problem Relation Age of Onset   Thyroid disease Mother     There are no problems to display for this patient.   Past Surgical History:  Procedure Laterality Date   TONSILLECTOMY       Current Outpatient Medications:    sulfamethoxazole-trimethoprim (BACTRIM DS) 800-160 MG tablet, Take 1 tablet by mouth 2 (two) times daily. (Patient not taking: Reported on 04/25/2022), Disp: 14 tablet, Rfl: 0  Allergies  Allergen Reactions   Corn-Containing Products Rash   Orange Fruit [Citrus] Rash    Patient Care Team: Delsa Grana, PA-C as PCP - General (Family Medicine)   Chart Review: I personally reviewed active problem list, medication list,  allergies, family history, social history, health maintenance, notes from last encounter, lab results, imaging with the patient/caregiver today.   Review of Systems  Constitutional: Negative.   HENT: Negative.    Eyes: Negative.   Respiratory: Negative.    Cardiovascular: Negative.   Gastrointestinal: Negative.   Endocrine: Negative.   Genitourinary: Negative.   Musculoskeletal: Negative.   Skin: Negative.   Allergic/Immunologic: Negative.   Neurological: Negative.   Hematological: Negative.   Psychiatric/Behavioral: Negative.    All other systems reviewed and are negative.         Objective:   Vitals:  Vitals:   04/25/22 1429  BP: 108/70  Pulse: 95  Resp: 16  Temp: 98.5 F (36.9 C)  TempSrc: Oral  SpO2: 98%  Weight: 207 lb 6.4 oz (94.1 kg)  Height: 6' 0.5" (1.842 m)    Body mass index is 27.74 kg/m.  Physical Exam Vitals and nursing note reviewed.  Constitutional:      General: He is not in acute distress.    Appearance: Normal appearance. He is well-developed and normal weight. He is not ill-appearing, toxic-appearing or diaphoretic.     Interventions: Face mask in place.  HENT:     Head: Normocephalic and atraumatic.     Jaw: No trismus.     Right Ear: Tympanic membrane, ear canal and external ear normal. There is no impacted cerumen.     Left Ear: Tympanic membrane, ear canal and external ear normal. There is no impacted cerumen.     Nose: Nose normal. No congestion or rhinorrhea.     Mouth/Throat:     Mouth: Mucous membranes are moist.     Pharynx: Oropharynx is clear. No oropharyngeal exudate or posterior oropharyngeal erythema.  Eyes:     General: Lids are normal. No scleral icterus.       Right eye: No  discharge.        Left eye: No discharge.     Conjunctiva/sclera: Conjunctivae normal.  Neck:     Trachea: Trachea and phonation normal. No tracheal deviation.  Cardiovascular:     Rate and Rhythm: Normal rate and regular rhythm.     Pulses:  Normal pulses.          Radial pulses are 2+ on the right side and 2+ on the left side.       Posterior tibial pulses are 2+ on the right side and 2+ on the left side.     Heart sounds: Normal heart sounds. No murmur heard.    No friction rub. No gallop.  Pulmonary:     Effort: Pulmonary effort is normal. No respiratory distress.     Breath sounds: Normal breath sounds. No stridor. No wheezing, rhonchi or rales.  Abdominal:     General: Bowel sounds are normal. There is no distension.     Palpations: Abdomen is soft.  Musculoskeletal:     Cervical back: Normal range of motion.     Right lower leg: No edema.     Left lower leg: No edema.  Lymphadenopathy:     Cervical: No cervical adenopathy.  Skin:    General: Skin is warm and dry.     Coloration: Skin is not jaundiced.     Findings: Rash present.     Nails: There is no clubbing.     Comments: Located to bilateral outer upper arms, slightly raised fine dry bumps with hyperpigmentation  Neurological:     Mental Status: He is alert. Mental status is at baseline.     Cranial Nerves: No dysarthria or facial asymmetry.     Motor: No tremor or abnormal muscle tone.     Gait: Gait normal.  Psychiatric:        Mood and Affect: Mood normal.        Speech: Speech normal.        Behavior: Behavior normal. Behavior is cooperative.      No results found for this or any previous visit (from the past 2160 hour(s)).  Fall Risk:    04/25/2022    2:27 PM 12/05/2021    1:25 PM 10/12/2020    8:41 AM 09/24/2019    8:48 AM 03/04/2019   12:24 PM  Fall Risk   Falls in the past year? 0 0 0 0 0  Number falls in past yr: 0 0 0 0 0  Injury with Fall? 0 0 0 0 0  Risk for fall due to : No Fall Risks No Fall Risks No Fall Risks    Follow up Falls prevention discussed;Education provided;Falls evaluation completed Falls prevention discussed;Education provided;Falls evaluation completed Falls prevention discussed Falls evaluation completed      Functional Status Survey: Is the patient deaf or have difficulty hearing?: No Does the patient have difficulty seeing, even when wearing glasses/contacts?: No Does the patient have difficulty concentrating, remembering, or making decisions?: No Does the patient have difficulty walking or climbing stairs?: No Does the patient have difficulty dressing or bathing?: No Does the patient have difficulty doing errands alone such as visiting a doctor's office or shopping?: No   Assessment & Plan:    CPE completed today  Prostate cancer screening and PSA options (with potential risks and benefits of testing vs not testing) were discussed along with recent recs/guidelines, shared decision making and handout/information given to pt today  USPSTF grade A and B  recommendations reviewed with patient; age-appropriate recommendations, preventive care, screening tests, etc discussed and encouraged; healthy living encouraged; see AVS for patient education given to patient  Discussed importance of 150 minutes of physical activity weekly, AHA exercise recommendations given to pt in AVS/handout  Discussed importance of healthy diet:  eating lean meats and proteins, avoiding trans fats and saturated fats, avoid simple sugars and excessive carbs in diet, eat 6 servings of fruit/vegetables daily and drink plenty of water and avoid sweet beverages.  DASH diet reviewed if pt has HTN  Recommended pt to do annual eye exam and routine dental exams/cleanings  Advance Care planning information and packet discussed and offered today, encouraged pt to discuss with family members/spouse/partner/friends and complete Advanced directive packet and bring copy to office   Reviewed Health Maintenance: Health Maintenance  Topic Date Due   COVID-19 Vaccine (1) 05/11/2022 (Originally 03/06/1998)   DTaP/Tdap/Td (8 - Td or Tdap) 08/20/2028   INFLUENZA VACCINE  Completed   HPV VACCINES  Completed   Hepatitis C Screening   Completed   HIV Screening  Completed    Immunizations: Immunization History  Administered Date(s) Administered   DTaP 11/02/1997, 01/02/1998, 03/03/1998, 01/05/1999, 09/08/2001   HIB (PRP-T) 11/02/1997, 01/02/1998, 09/07/1998   HPV Quadrivalent 09/19/2008, 09/14/2009, 09/13/2010   Hepatitis A 09/19/2008, 09/14/2009   Hepatitis B 11/02/1997, 01/02/1998, 05/28/2009   IPV 11/02/1997, 01/02/1998, 03/03/1998, 09/08/2001   Influenza,inj,Quad PF,6+ Mos 12/05/2021   MMR 01/05/1999, 09/08/2001   Meningococcal Conjugate 09/19/2008, 11/26/2013   Pneumococcal Conjugate-13 04/23/1999, 09/13/1999   Tdap 09/19/2008, 08/21/2018   Varicella 09/07/1998, 09/19/2008   Vaccines:  HPV: up to at age 46 , ask insurance if age between 74-45  Shingrix: 52-64 yo and ask insurance if covered when patient above 74 yo Pneumonia: not indicated educated and discussed with patient. Flu:  educated and discussed with patient. COVID:       ICD-10-CM   1. Annual physical exam  123456 COMPLETE METABOLIC PANEL WITH GFR    CBC with Differential/Platelet    Lipid panel    Urine cytology ancillary only    RPR    HIV Antibody (routine testing w rflx)    2. Rash and nonspecific skin eruption  R21 Ambulatory referral to Dermatology   to bilateral upper arms, slightly raised fine dry bumps with hyperpigmentation, sometimes itch, had for whole life - he asks for derm consult    3. High risk heterosexual behavior  Z72.51 Urine cytology ancillary only    RPR    HIV Antibody (routine testing w rflx)    4. Screening for STD (sexually transmitted disease)  Z11.3 Urine cytology ancillary only    RPR    HIV Antibody (routine testing w rflx)     Pt does smoke marijuana, says no cig smoke or vaping nicotine products Stopped ETOH for past 2 weeks - reviewed concerning behaviors and moderate amount of ETOH       Delsa Grana, PA-C 04/25/22 2:40 PM  [facility]

## 2022-04-26 LAB — COMPLETE METABOLIC PANEL WITH GFR
AG Ratio: 1.8 (calc) (ref 1.0–2.5)
ALT: 16 U/L (ref 9–46)
AST: 17 U/L (ref 10–40)
Albumin: 4.9 g/dL (ref 3.6–5.1)
Alkaline phosphatase (APISO): 68 U/L (ref 36–130)
BUN: 20 mg/dL (ref 7–25)
CO2: 25 mmol/L (ref 20–32)
Calcium: 10.1 mg/dL (ref 8.6–10.3)
Chloride: 103 mmol/L (ref 98–110)
Creat: 1.04 mg/dL (ref 0.60–1.24)
Globulin: 2.8 g/dL (calc) (ref 1.9–3.7)
Glucose, Bld: 83 mg/dL (ref 65–99)
Potassium: 4 mmol/L (ref 3.5–5.3)
Sodium: 140 mmol/L (ref 135–146)
Total Bilirubin: 0.7 mg/dL (ref 0.2–1.2)
Total Protein: 7.7 g/dL (ref 6.1–8.1)
eGFR: 103 mL/min/{1.73_m2} (ref >=60)

## 2022-04-26 LAB — RPR: RPR Ser Ql: NONREACTIVE

## 2022-04-26 LAB — CBC WITH DIFFERENTIAL/PLATELET
Absolute Monocytes: 270 cells/uL (ref 200–950)
Basophils Absolute: 30 cells/uL (ref 0–200)
Neutro Abs: 1880 cells/uL (ref 1500–7800)
Neutrophils Relative %: 50.8 %
Platelets: 274 10*3/uL (ref 140–400)
Total Lymphocyte: 40.3 %

## 2022-04-26 LAB — HIV ANTIBODY (ROUTINE TESTING W REFLEX): HIV 1&2 Ab, 4th Generation: NONREACTIVE

## 2022-04-26 LAB — LIPID PANEL
Cholesterol: 216 mg/dL — ABNORMAL HIGH (ref <200)
HDL: 54 mg/dL (ref >=40)
LDL Cholesterol (Calc): 143 mg/dL (calc) — ABNORMAL HIGH
Non-HDL Cholesterol (Calc): 162 mg/dL (calc) — ABNORMAL HIGH (ref <130)
Total CHOL/HDL Ratio: 4 (calc) (ref <5.0)
Triglycerides: 87 mg/dL (ref <150)

## 2022-04-29 LAB — URINE CYTOLOGY ANCILLARY ONLY
Chlamydia: NEGATIVE
Comment: NEGATIVE
Comment: NEGATIVE
Comment: NORMAL
Neisseria Gonorrhea: NEGATIVE
Trichomonas: NEGATIVE

## 2022-06-07 ENCOUNTER — Encounter: Payer: 59 | Admitting: Family Medicine

## 2022-08-07 ENCOUNTER — Other Ambulatory Visit (HOSPITAL_COMMUNITY)
Admission: RE | Admit: 2022-08-07 | Discharge: 2022-08-07 | Disposition: A | Discharge status: Home / Self Care | Payer: 59 | Source: Ambulatory Visit | Attending: Family Medicine | Admitting: Family Medicine

## 2022-08-07 ENCOUNTER — Ambulatory Visit (INDEPENDENT_AMBULATORY_CARE_PROVIDER_SITE_OTHER): Payer: 59 | Admitting: Family Medicine

## 2022-08-07 ENCOUNTER — Encounter: Payer: Self-pay | Admitting: Family Medicine

## 2022-08-07 VITALS — BP 130/70 | HR 81 | Temp 97.6°F | Resp 16 | Ht 72.5 in | Wt 188.8 lb

## 2022-08-07 DIAGNOSIS — Z113 Encounter for screening for infections with a predominantly sexual mode of transmission: Secondary | ICD-10-CM | POA: Insufficient documentation

## 2022-08-07 DIAGNOSIS — E782 Mixed hyperlipidemia: Secondary | ICD-10-CM

## 2022-08-07 DIAGNOSIS — Z7251 High risk heterosexual behavior: Secondary | ICD-10-CM | POA: Diagnosis not present

## 2022-08-07 NOTE — Progress Notes (Signed)
Patient ID: Edward Miller, male    DOB: July 13, 1995, 27 y.o.   MRN: [id number]  PCP: Danelle Berry, PA-C  Chief Complaint  Patient presents with   Exposure to STD    Regular check up    Subjective:   Edward Miller is a 27 y.o. male, presents to clinic with CC of the following:  HPI  Here for STD screening, hx of chlamydia in 2020 and 2021, several neg STD screenings since, he comes for testing when he has new partners or doesn't use condoms Sexual hx:   multiple partners - at least 8 women int he past year, sometimes uses condoms # of partners - 2 new sexual partners since last screening 4 months ago - females, no known STD exposure from these 2 new women Condoms - no condom use recently Sx:  none  Patient Active Problem List   Diagnosis Date Noted   High risk heterosexual behavior 08/07/2022   Moderate mixed hyperlipidemia not requiring statin therapy 08/07/2022      Current Outpatient Medications:    sulfamethoxazole-trimethoprim (BACTRIM DS) 800-160 MG tablet, Take 1 tablet by mouth 2 (two) times daily. (Patient not taking: Reported on 04/25/2022), Disp: 14 tablet, Rfl: 0   Allergies  Allergen Reactions   Corn-Containing Products Rash   Orange Fruit [Citrus] Rash     Social History   Tobacco Use   Smoking status: Never   Smokeless tobacco: Never  Vaping Use   Vaping Use: Never used  Substance Use Topics   Alcohol use: Not Currently   Drug use: Yes    Types: Marijuana      Chart Review Today: I personally reviewed active problem list, medication list, allergies, family history, social history, health maintenance, notes from last encounter, lab results, imaging with the patient/caregiver today.   Review of Systems  Constitutional: Negative.   HENT: Negative.    Eyes: Negative.   Respiratory: Negative.    Cardiovascular: Negative.   Gastrointestinal: Negative.   Endocrine: Negative.   Genitourinary: Negative.   Musculoskeletal: Negative.    Skin: Negative.   Allergic/Immunologic: Negative.   Neurological: Negative.   Hematological: Negative.   Psychiatric/Behavioral: Negative.    All other systems reviewed and are negative.      Objective:   Vitals:   08/07/22 1126  BP: 130/70  Pulse: 81  Resp: 16  Temp: 97.6 F (36.4 C)  TempSrc: Oral  SpO2: 99%  Weight: 188 lb 12.8 oz (85.6 kg)  Height: 6' 0.5" (1.842 m)    Body mass index is 25.25 kg/m.  Physical Exam Vitals and nursing note reviewed.  Constitutional:      General: He is not in acute distress.    Appearance: Normal appearance. He is well-developed. He is not ill-appearing, toxic-appearing or diaphoretic.  HENT:     Head: Normocephalic and atraumatic.     Nose: Nose normal.  Eyes:     General:        Right eye: No discharge.        Left eye: No discharge.     Conjunctiva/sclera: Conjunctivae normal.  Neck:     Trachea: No tracheal deviation.  Cardiovascular:     Rate and Rhythm: Normal rate and regular rhythm.  Pulmonary:     Effort: Pulmonary effort is normal. No respiratory distress.     Breath sounds: No stridor.  Musculoskeletal:        General: Normal range of motion.  Skin:    General: Skin  is warm and dry.     Findings: No rash.  Neurological:     Mental Status: He is alert.     Motor: No abnormal muscle tone.     Coordination: Coordination normal.  Psychiatric:        Behavior: Behavior normal.      Results for orders placed or performed in visit on 04/25/22  COMPLETE METABOLIC PANEL WITH GFR  Result Value Ref Range   Glucose, Bld 83 65 - 99 mg/dL   BUN 20 7 - 25 mg/dL   Creat 1.19 1.47 - 8.29 mg/dL   eGFR 562 > OR = 60 ZH/YQM/5.78I6   BUN/Creatinine Ratio SEE NOTE: 6 - 22 (calc)   Sodium 140 135 - 146 mmol/L   Potassium 4.0 3.5 - 5.3 mmol/L   Chloride 103 98 - 110 mmol/L   CO2 25 20 - 32 mmol/L   Calcium 10.1 8.6 - 10.3 mg/dL   Total Protein 7.7 6.1 - 8.1 g/dL   Albumin 4.9 3.6 - 5.1 g/dL   Globulin 2.8 1.9 - 3.7  g/dL (calc)   AG Ratio 1.8 1.0 - 2.5 (calc)   Total Bilirubin 0.7 0.2 - 1.2 mg/dL   Alkaline phosphatase (APISO) 68 36 - 130 U/L   AST 17 10 - 40 U/L   ALT 16 9 - 46 U/L  CBC with Differential/Platelet  Result Value Ref Range   WBC 3.7 (L) 3.8 - 10.8 Thousand/uL   RBC 5.19 4.20 - 5.80 Million/uL   Hemoglobin 15.6 13.2 - 17.1 g/dL   HCT 96.2 95.2 - 84.1 %   MCV 87.1 80.0 - 100.0 fL   MCH 30.1 27.0 - 33.0 pg   MCHC 34.5 32.0 - 36.0 g/dL   RDW 32.4 40.1 - 02.7 %   Platelets 274 140 - 400 Thousand/uL   MPV 11.0 7.5 - 12.5 fL   Neutro Abs 1,880 1,500 - 7,800 cells/uL   Lymphs Abs 1,491 850 - 3,900 cells/uL   Absolute Monocytes 270 200 - 950 cells/uL   Eosinophils Absolute 30 15 - 500 cells/uL   Basophils Absolute 30 0 - 200 cells/uL   Neutrophils Relative % 50.8 %   Total Lymphocyte 40.3 %   Monocytes Relative 7.3 %   Eosinophils Relative 0.8 %   Basophils Relative 0.8 %  Lipid panel  Result Value Ref Range   Cholesterol 216 (H) <200 mg/dL   HDL 54 > OR = 40 mg/dL   Triglycerides 87 <253 mg/dL   LDL Cholesterol (Calc) 143 (H) mg/dL (calc)   Total CHOL/HDL Ratio 4.0 <5.0 (calc)   Non-HDL Cholesterol (Calc) 162 (H) <130 mg/dL (calc)  RPR  Result Value Ref Range   RPR Ser Ql NON-REACTIVE NON-REACTIVE  HIV Antibody (routine testing w rflx)  Result Value Ref Range   HIV 1&2 Ab, 4th Generation NON-REACTIVE NON-REACTIVE  Urine cytology ancillary only  Result Value Ref Range   Neisseria Gonorrhea Negative    Chlamydia Negative    Trichomonas Negative    Comment Normal Reference Range Trichomonas - Negative    Comment Normal Reference [place] Chlamydia - Negative    Comment      Normal Reference Range Neisseria Gonorrhea - Negative       Assessment & Plan:   1. Screening for STD (sexually transmitted disease) New partners, male, multiple, without condom use, testing requested, asx Discussed tx of any positive results Reviewed safe sex practices Pt did complete HPV  vaccine series encouraged condom use for protection  from STI's - herpes, HPV, GC/chlamydia, HIV, trich etc - Urine cytology ancillary only - HIV Antibody (routine testing w rflx) - RPR  2. High risk heterosexual behavior - Urine cytology ancillary only - HIV Antibody (routine testing w rflx) - RPR  3. hyperlipidemia not requiring statin therapy Labs from last CPE in march show high cholesterol Low ASCVD risk and family hx, manage with diet/lifestyle (added to problem list today) Lab Results  Component Value Date   CHOL 216 (H) 04/25/2022   HDL 54 04/25/2022   LDLCALC 143 (H) 04/25/2022   TRIG 87 04/25/2022   CHOLHDL 4.0 04/25/2022         Danelle Berry, PA-C 08/07/22 11:39 AM

## 2022-08-08 LAB — URINE CYTOLOGY ANCILLARY ONLY
Chlamydia: NEGATIVE
Comment: NEGATIVE
Comment: NORMAL
Neisseria Gonorrhea: NEGATIVE

## 2022-08-08 LAB — RPR: RPR Ser Ql: NONREACTIVE

## 2022-08-08 LAB — HIV ANTIBODY (ROUTINE TESTING W REFLEX): HIV 1&2 Ab, 4th Generation: NONREACTIVE

## 2022-10-11 ENCOUNTER — Other Ambulatory Visit (HOSPITAL_BASED_OUTPATIENT_CLINIC_OR_DEPARTMENT_OTHER): Payer: Self-pay

## 2023-09-04 ENCOUNTER — Ambulatory Visit (INDEPENDENT_AMBULATORY_CARE_PROVIDER_SITE_OTHER): Admitting: Family Medicine

## 2023-09-04 ENCOUNTER — Encounter: Payer: Self-pay | Admitting: Family Medicine

## 2023-09-04 ENCOUNTER — Other Ambulatory Visit (HOSPITAL_COMMUNITY)
Admission: RE | Admit: 2023-09-04 | Discharge: 2023-09-04 | Disposition: A | Discharge status: Home / Self Care | Source: Ambulatory Visit | Attending: Family Medicine | Admitting: Family Medicine

## 2023-09-04 VITALS — BP 122/70 | HR 85 | Resp 16 | Ht 72.0 in | Wt 205.0 lb

## 2023-09-04 DIAGNOSIS — E782 Mixed hyperlipidemia: Secondary | ICD-10-CM

## 2023-09-04 DIAGNOSIS — Z113 Encounter for screening for infections with a predominantly sexual mode of transmission: Secondary | ICD-10-CM | POA: Insufficient documentation

## 2023-09-04 DIAGNOSIS — D72819 Decreased white blood cell count, unspecified: Secondary | ICD-10-CM

## 2023-09-04 DIAGNOSIS — Z7251 High risk heterosexual behavior: Secondary | ICD-10-CM

## 2023-09-04 NOTE — Assessment & Plan Note (Signed)
 4-5 + male sexual partners in the past 12 months, in the past 2 months only one male partner, always uses condoms  Discussed sexual health, STI's and continued barrier methods of protection Here for screening labs today asx

## 2023-09-04 NOTE — Assessment & Plan Note (Signed)
 Recheck lipids Reviewed indications for medications and encouraged diet/lifestyle efforts Lab Results  Component Value Date   CHOL 216 (H) 04/25/2022   HDL 54 04/25/2022   LDLCALC 143 (H) 04/25/2022   TRIG 87 04/25/2022   CHOLHDL 4.0 04/25/2022

## 2023-09-04 NOTE — Progress Notes (Signed)
 Patient ID: Edward Miller, male    DOB: 10-28-1995, 28 y.o.   MRN: [id number]  PCP: Leavy Mole, PA-C  Chief Complaint  Patient presents with   Medical Management of Chronic Issues    General health check and STD screen- no exposure    Subjective:   Edward Miller is a 28 y.o. male, presents to clinic with CC of the following:  HPI  One male partner for 2 months, 4-5 other male partners in the past 12 months, he always uses condoms No known exposure to STI Denies any sx, no urinary sx, penile discharge, abd/flank pain    Patient Active Problem List   Diagnosis Date Noted   High risk heterosexual behavior 08/07/2022   Moderate mixed hyperlipidemia not requiring statin therapy 08/07/2022     No current outpatient medications on file.   Allergies  Allergen Reactions   Corn-Containing Products Rash   Orange Fruit [Citrus] Rash     Social History   Tobacco Use   Smoking status: Never   Smokeless tobacco: Never  Vaping Use   Vaping status: Never Used  Substance Use Topics   Alcohol use: Not Currently   Drug use: Yes    Types: Marijuana      Chart Review Today: I personally reviewed active problem list, medication list, allergies, family history, social history, health maintenance, notes from last encounter, lab results, imaging with the patient/caregiver today.   Review of Systems  Constitutional: Negative.   HENT: Negative.    Eyes: Negative.   Respiratory: Negative.    Cardiovascular: Negative.   Gastrointestinal: Negative.   Endocrine: Negative.   Genitourinary: Negative.   Musculoskeletal: Negative.   Skin: Negative.   Allergic/Immunologic: Negative.   Neurological: Negative.   Hematological: Negative.   Psychiatric/Behavioral: Negative.    All other systems reviewed and are negative.      Objective:   Vitals:   09/04/23 1319  BP: 122/70  Pulse: 85  Resp: 16  SpO2: 99%  Weight: 205 lb (93 kg)  Height: 6' (1.829 m)     Body mass index is 27.8 kg/m.  Physical Exam Vitals and nursing note reviewed.  Constitutional:      General: He is not in acute distress.    Appearance: Normal appearance. He is well-developed. He is not ill-appearing, toxic-appearing or diaphoretic.  HENT:     Head: Normocephalic and atraumatic.     Right Ear: External ear normal.     Left Ear: External ear normal.     Nose: Nose normal.  Eyes:     General: No scleral icterus.       Right eye: No discharge.        Left eye: No discharge.     Conjunctiva/sclera: Conjunctivae normal.  Neck:     Trachea: No tracheal deviation.  Cardiovascular:     Rate and Rhythm: Normal rate.  Pulmonary:     Effort: Pulmonary effort is normal. No respiratory distress.     Breath sounds: No stridor.  Skin:    General: Skin is warm and dry.     Findings: No rash.  Neurological:     Mental Status: He is alert.     Motor: No abnormal muscle tone.     Coordination: Coordination normal.     Gait: Gait normal.  Psychiatric:        Mood and Affect: Mood normal.        Behavior: Behavior normal.  Results for orders placed or performed in visit on 08/07/22  Urine cytology ancillary only   Collection Time: 08/07/22 11:36 AM  Result Value Ref Range   Neisseria Gonorrhea Negative    Chlamydia Negative    Comment Normal Reference Ranger Chlamydia - Negative    Comment      Normal Reference Range Neisseria Gonorrhea - Negative  HIV Antibody (routine testing w rflx)   Collection Time: 08/07/22 11:47 AM  Result Value Ref Range   HIV 1&2 Ab, 4th Generation NON-REACTIVE NON-REACTIVE  RPR   Collection Time: 08/07/22 11:47 AM  Result Value Ref Range   RPR Ser Ql NON-REACTIVE NON-REACTIVE       Assessment & Plan:   Problem List Items Addressed This Visit     High risk heterosexual behavior   4-5 + male sexual partners in the past 12 months, in the past 2 months only one male partner, always uses condoms  Discussed sexual health,  STI's and continued barrier methods of protection Here for screening labs today asx      Relevant Orders   Urine cytology ancillary only   HIV antibody (with reflex)   RPR   Moderate mixed hyperlipidemia not requiring statin therapy   Recheck lipids Reviewed indications for medications and encouraged diet/lifestyle efforts Lab Results  Component Value Date   CHOL 216 (H) 04/25/2022   HDL 54 04/25/2022   LDLCALC 143 (H) 04/25/2022   TRIG 87 04/25/2022   CHOLHDL 4.0 04/25/2022         Relevant Orders   Comprehensive metabolic panel with GFR   Lipid panel   Other Visit Diagnoses       Screen for STD (sexually transmitted disease)    -  Primary   Relevant Orders   Urine cytology ancillary only   HIV antibody (with reflex)   RPR     Leukopenia, unspecified type       WBC mildly low with last labs, recheck   Relevant Orders   CBC with Differential/Platelet       Return in about 1 year (around 09/03/2024) for Annual Physical.     Michelene Cower, PA-C 09/04/23 1:41 PM

## 2023-09-05 LAB — HIV ANTIBODY (ROUTINE TESTING W REFLEX): HIV 1&2 Ab, 4th Generation: NONREACTIVE

## 2023-09-05 LAB — LIPID PANEL
Cholesterol: 180 mg/dL (ref <200)
HDL: 67 mg/dL (ref >=40)
LDL Cholesterol (Calc): 99 mg/dL
Non-HDL Cholesterol (Calc): 113 mg/dL (ref <130)
Total CHOL/HDL Ratio: 2.7 (calc) (ref <5.0)
Triglycerides: 49 mg/dL (ref <150)

## 2023-09-05 LAB — CBC WITH DIFFERENTIAL/PLATELET
Absolute Lymphocytes: 1414 {cells}/uL (ref 850–3900)
Absolute Monocytes: 299 {cells}/uL (ref 200–950)
Basophils Absolute: 20 {cells}/uL (ref 0–200)
Basophils Relative: 0.6 %
Eosinophils Absolute: 61 {cells}/uL (ref 15–500)
Eosinophils Relative: 1.8 %
HCT: 39.7 % (ref 38.5–50.0)
Hemoglobin: 13.1 g/dL — ABNORMAL LOW (ref 13.2–17.1)
MCH: 30 pg (ref 27.0–33.0)
MCHC: 33 g/dL (ref 32.0–36.0)
MCV: 91.1 fL (ref 80.0–100.0)
MPV: 11 fL (ref 7.5–12.5)
Monocytes Relative: 8.8 %
Neutro Abs: 1605 {cells}/uL (ref 1500–7800)
Neutrophils Relative %: 47.2 %
Platelets: 226 Thousand/uL (ref 140–400)
RBC: 4.36 Million/uL (ref 4.20–5.80)
RDW: 12.8 % (ref 11.0–15.0)
Total Lymphocyte: 41.6 %
WBC: 3.4 Thousand/uL — ABNORMAL LOW (ref 3.8–10.8)

## 2023-09-05 LAB — COMPREHENSIVE METABOLIC PANEL WITH GFR
AG Ratio: 2.3 (calc) (ref 1.0–2.5)
ALT: 23 U/L (ref 9–46)
AST: 20 U/L (ref 10–40)
Albumin: 4.5 g/dL (ref 3.6–5.1)
Alkaline phosphatase (APISO): 65 U/L (ref 36–130)
BUN: 13 mg/dL (ref 7–25)
CO2: 29 mmol/L (ref 20–32)
Calcium: 9.5 mg/dL (ref 8.6–10.3)
Chloride: 104 mmol/L (ref 98–110)
Creat: 1.05 mg/dL (ref 0.60–1.24)
Globulin: 2 g/dL (ref 1.9–3.7)
Glucose, Bld: 87 mg/dL (ref 65–99)
Potassium: 4.1 mmol/L (ref 3.5–5.3)
Sodium: 140 mmol/L (ref 135–146)
Total Bilirubin: 0.5 mg/dL (ref 0.2–1.2)
Total Protein: 6.5 g/dL (ref 6.1–8.1)
eGFR: 100 mL/min/1.73m2 (ref >=60)

## 2023-09-05 LAB — RPR: RPR Ser Ql: NONREACTIVE

## 2023-09-08 ENCOUNTER — Ambulatory Visit: Payer: Self-pay | Admitting: Family Medicine

## 2023-09-08 LAB — URINE CYTOLOGY ANCILLARY ONLY
Chlamydia: NEGATIVE
Comment: NEGATIVE
Comment: NORMAL
Neisseria Gonorrhea: NEGATIVE
